# Patient Record
Sex: Female | Born: 1965 | Race: White | Hispanic: No | Marital: Married | Smoking: Never smoker
Health system: Southern US, Community
[De-identification: ages and names within clinical notes are randomized; demographics above are authoritative.]

## PROBLEM LIST (undated history)

## (undated) ENCOUNTER — Ambulatory Visit (HOSPITAL_COMMUNITY): Payer: 59

## (undated) DIAGNOSIS — S82409A Unspecified fracture of shaft of unspecified fibula, initial encounter for closed fracture: Secondary | ICD-10-CM

## (undated) DIAGNOSIS — I471 Supraventricular tachycardia, unspecified: Secondary | ICD-10-CM

## (undated) DIAGNOSIS — C4491 Basal cell carcinoma of skin, unspecified: Secondary | ICD-10-CM

## (undated) DIAGNOSIS — M84371A Stress fracture, right ankle, initial encounter for fracture: Secondary | ICD-10-CM

## (undated) DIAGNOSIS — M7740 Metatarsalgia, unspecified foot: Secondary | ICD-10-CM

## (undated) DIAGNOSIS — R55 Syncope and collapse: Secondary | ICD-10-CM

## (undated) DIAGNOSIS — M65979 Unspecified synovitis and tenosynovitis, unspecified ankle and foot: Secondary | ICD-10-CM

## (undated) DIAGNOSIS — M25572 Pain in left ankle and joints of left foot: Secondary | ICD-10-CM

## (undated) DIAGNOSIS — M659 Synovitis and tenosynovitis, unspecified: Secondary | ICD-10-CM

## (undated) DIAGNOSIS — M25552 Pain in left hip: Secondary | ICD-10-CM

## (undated) HISTORY — DX: Stress fracture, right ankle, initial encounter for fracture: M84.371A

## (undated) HISTORY — DX: Pain in left ankle and joints of left foot: M25.572

## (undated) HISTORY — DX: Synovitis and tenosynovitis, unspecified: M65.9

## (undated) HISTORY — DX: Syncope and collapse: R55

## (undated) HISTORY — DX: Supraventricular tachycardia: I47.1

## (undated) HISTORY — DX: Unspecified fracture of shaft of unspecified fibula, initial encounter for closed fracture: S82.409A

## (undated) HISTORY — PX: WISDOM TOOTH EXTRACTION: SHX21

## (undated) HISTORY — DX: Supraventricular tachycardia, unspecified: I47.10

## (undated) HISTORY — DX: Metatarsalgia, unspecified foot: M77.40

## (undated) HISTORY — PX: MOHS SURGERY: SHX181

## (undated) HISTORY — DX: Unspecified synovitis and tenosynovitis, unspecified ankle and foot: M65.979

## (undated) HISTORY — DX: Basal cell carcinoma of skin, unspecified: C44.91

## (undated) HISTORY — DX: Pain in left hip: M25.552

---

## 1999-03-18 ENCOUNTER — Other Ambulatory Visit: Admission: RE | Admit: 1999-03-18 | Discharge: 1999-03-18 | Payer: Self-pay | Admitting: *Deleted

## 2000-04-12 ENCOUNTER — Other Ambulatory Visit: Admission: RE | Admit: 2000-04-12 | Discharge: 2000-04-12 | Payer: Self-pay | Admitting: *Deleted

## 2001-04-14 ENCOUNTER — Other Ambulatory Visit: Admission: RE | Admit: 2001-04-14 | Discharge: 2001-04-14 | Payer: Self-pay | Admitting: *Deleted

## 2002-04-18 ENCOUNTER — Other Ambulatory Visit: Admission: RE | Admit: 2002-04-18 | Discharge: 2002-04-18 | Payer: Self-pay | Admitting: *Deleted

## 2003-05-25 ENCOUNTER — Other Ambulatory Visit: Admission: RE | Admit: 2003-05-25 | Discharge: 2003-05-25 | Payer: Self-pay | Admitting: *Deleted

## 2007-03-22 ENCOUNTER — Other Ambulatory Visit: Admission: RE | Admit: 2007-03-22 | Discharge: 2007-03-22 | Payer: Self-pay | Admitting: Obstetrics and Gynecology

## 2007-12-22 ENCOUNTER — Ambulatory Visit: Payer: Self-pay | Admitting: Cardiology

## 2008-01-10 ENCOUNTER — Encounter: Payer: Self-pay | Admitting: Cardiology

## 2008-01-10 ENCOUNTER — Ambulatory Visit: Payer: Self-pay | Admitting: Cardiology

## 2008-01-10 ENCOUNTER — Ambulatory Visit: Payer: Self-pay

## 2008-01-10 LAB — CONVERTED CEMR LAB
CO2: 27 meq/L (ref 19–32)
Chloride: 105 meq/L (ref 96–112)
Magnesium: 2.4 mg/dL (ref 1.5–2.5)
Potassium: 5.2 meq/L — ABNORMAL HIGH (ref 3.5–5.1)
Sodium: 139 meq/L (ref 135–145)
TSH: 0.86 microintl units/mL (ref 0.35–5.50)

## 2008-02-02 ENCOUNTER — Ambulatory Visit: Payer: Self-pay | Admitting: Cardiology

## 2008-03-22 ENCOUNTER — Other Ambulatory Visit: Admission: RE | Admit: 2008-03-22 | Discharge: 2008-03-22 | Payer: Self-pay | Admitting: Obstetrics and Gynecology

## 2008-04-04 ENCOUNTER — Ambulatory Visit: Payer: Self-pay | Admitting: Internal Medicine

## 2008-10-29 ENCOUNTER — Ambulatory Visit: Payer: Self-pay | Admitting: Sports Medicine

## 2008-10-29 DIAGNOSIS — M25559 Pain in unspecified hip: Secondary | ICD-10-CM | POA: Insufficient documentation

## 2008-10-29 DIAGNOSIS — M7750 Other enthesopathy of unspecified foot: Secondary | ICD-10-CM | POA: Insufficient documentation

## 2008-10-30 ENCOUNTER — Telehealth (INDEPENDENT_AMBULATORY_CARE_PROVIDER_SITE_OTHER): Payer: Self-pay | Admitting: *Deleted

## 2008-12-11 ENCOUNTER — Ambulatory Visit: Payer: Self-pay | Admitting: Sports Medicine

## 2008-12-19 ENCOUNTER — Ambulatory Visit: Payer: Self-pay | Admitting: Sports Medicine

## 2008-12-19 DIAGNOSIS — M25579 Pain in unspecified ankle and joints of unspecified foot: Secondary | ICD-10-CM | POA: Insufficient documentation

## 2008-12-19 DIAGNOSIS — S82409A Unspecified fracture of shaft of unspecified fibula, initial encounter for closed fracture: Secondary | ICD-10-CM | POA: Insufficient documentation

## 2008-12-27 ENCOUNTER — Telehealth (INDEPENDENT_AMBULATORY_CARE_PROVIDER_SITE_OTHER): Payer: Self-pay | Admitting: *Deleted

## 2009-01-01 ENCOUNTER — Ambulatory Visit: Payer: Self-pay | Admitting: Sports Medicine

## 2009-01-30 ENCOUNTER — Ambulatory Visit: Payer: Self-pay | Admitting: Sports Medicine

## 2009-04-16 ENCOUNTER — Other Ambulatory Visit: Admission: RE | Admit: 2009-04-16 | Discharge: 2009-04-16 | Payer: Self-pay | Admitting: Obstetrics and Gynecology

## 2009-04-24 ENCOUNTER — Ambulatory Visit: Payer: Self-pay | Admitting: Sports Medicine

## 2009-04-24 DIAGNOSIS — M659 Synovitis and tenosynovitis, unspecified: Secondary | ICD-10-CM | POA: Insufficient documentation

## 2009-04-24 DIAGNOSIS — M8430XA Stress fracture, unspecified site, initial encounter for fracture: Secondary | ICD-10-CM | POA: Insufficient documentation

## 2009-05-09 ENCOUNTER — Ambulatory Visit: Payer: Self-pay | Admitting: Sports Medicine

## 2009-05-28 ENCOUNTER — Encounter (INDEPENDENT_AMBULATORY_CARE_PROVIDER_SITE_OTHER): Payer: Self-pay | Admitting: *Deleted

## 2009-06-06 ENCOUNTER — Encounter: Payer: Self-pay | Admitting: Sports Medicine

## 2009-06-06 ENCOUNTER — Ambulatory Visit: Payer: Self-pay | Admitting: Sports Medicine

## 2009-06-07 DIAGNOSIS — I498 Other specified cardiac arrhythmias: Secondary | ICD-10-CM | POA: Insufficient documentation

## 2009-06-07 DIAGNOSIS — R55 Syncope and collapse: Secondary | ICD-10-CM | POA: Insufficient documentation

## 2009-06-11 ENCOUNTER — Ambulatory Visit: Payer: Self-pay | Admitting: Cardiology

## 2009-07-09 ENCOUNTER — Ambulatory Visit: Payer: Self-pay | Admitting: Sports Medicine

## 2009-07-09 DIAGNOSIS — R269 Unspecified abnormalities of gait and mobility: Secondary | ICD-10-CM | POA: Insufficient documentation

## 2010-02-26 ENCOUNTER — Encounter: Payer: Self-pay | Admitting: Family Medicine

## 2010-02-26 ENCOUNTER — Ambulatory Visit: Payer: Self-pay | Admitting: Sports Medicine

## 2010-08-28 NOTE — Assessment & Plan Note (Signed)
Summary: L LATERAL ANKLE PAIN X MON   Vital Signs:  Patient profile:   45 year old female BP sitting:   102 / 67  Vitals Entered By: Lillia Pauls CMA (February 26, 2010 2:51 PM)  Primary Samiksha Pellicano:  [facility]   History of Present Illness: 45 yo F avid runner her for foot/ankle pain.  Runs 4-5 mi/day. Everted L ankle sprain during her run 2 days ago. Began having swelling and pain that night and yesterday. Swelling is better today. Pain is located on anterior aspect of lateral malleolus. Sharp feeling similar to prior stress fx on  R med malleolus and post aspect of lat malleolus. Able to walk fine today.  Allergies: No Known Drug Allergies  Physical Exam  General:  Well-developed,well-nourished,in no acute distress; alert,appropriate and cooperative throughout examination   Foot/Ankle Exam   L Ankle: No visible erythema or swelling. Range of motion is full in all directions. Strength is 5/5 in all directions. Stable lateral and medial ligaments; squeeze test and kleiger test unremarkable; Talar dome nontender; No pain at base of 5th MT; No tenderness over cuboid; No tenderness over N spot or navicular prominence No tenderness on posterior aspects of medial malleolus, mild TTP on ant aspect No sign of peroneal tendon subluxations; Negative tarsal tunnel tinel's Able to walk 4 steps. Mild laxity on anterior draw, neg talar tilt. B/l Morton's foot with separation between 1st/2nd toes.  + transverse arch collpase b/l.    Impression & Recommendations:  Problem # 1:  ANKLE PAIN, LEFT (ICD-719.47) Pt concerned about repeat stress fracture, but explained only appears to be grade 1 ankle sprain given acute setting.  No need for imaging based on [place] Ankle Rules - ASO brace to wear as needed (particularly on trail runs) - NSAIDs if desired - Given handout on ankle rehab and stressed importance ligamentous strengthening.  We reviewed exercises with her today as  well and gave her theraband. - given only mild injury, f/u as needed  Orders: Ankle Training Brace/ASO Support [phone])  Complete Medication List: 1)  Voltaren 1 % Gel (Diclofenac sodium) .... Use 1 gram qid prn 2)  Calicum  .... 2000mg  daily 3)  Multivitamins Tabs (Multiple vitamin) .Marland Kitchen.. 1 tab once daily

## 2010-12-09 NOTE — Assessment & Plan Note (Signed)
[facility]                            CARDIOLOGY OFFICE NOTE   NAME:Whiteford, IDALIA ALLBRITTON                  MRN:          [id number]  DATE:12/22/2007                            DOB:          Jul 08, 1966    REFERRING PHYSICIAN:  Sigmund Hazel, M.D.   REFERRING PHYSICIAN AND PRIMARY CARE PHYSICIAN:  Dr. Sigmund Hazel with  [facility] at [facility].   REASON FOR REFERRAL:  Evaluation of palpitations.   CLINICAL HISTORY:  Ms. Mannina is 45 years old and is known to me through  her parents, Takiya Belmares and Mr. Frederica Kuster who are patient's of mine.  She  also works in the Engineer, civil (consulting) for [employer]  She has  had tachy palpitations for about two years although they may have been  slightly more frequent recently.  She has become an active runner over  the last 6-12 months, and she has noticed that the symptoms occur most  off and then almost exclusively now with running.  She normally gets her  heart rate up to about 170 which is near her maximum predicted rate when  she runs, but some times her heart rate will jump up over 201 and once  to 240.  She usually stops her running when this happens, and the heart  rate will usually suddenly slow down.  She wears a heart rate monitor  but has not noticed whether her heart rate is irregular.  She has no  other symptoms other than the palpitations associated with the rapid  heart rate.  She has had no chest pain or shortness of breath.   She does not drink caffeine or excess alcohol.  She also has no prior  history of known heart disease.   Her past medical history is mostly negative.  She has had a previous C-  section in 1997.  There is no history of diabetes, hypertension, or  hyperlipidemia.   SOCIAL HISTORY:  She is married and has one 43 year old boy.  She does  not smoke.   FAMILY HISTORY:  Is positive in her father has had previous bypass  surgery and her mother has an SVT and  left bundle branch block.   REVIEW OF SYSTEMS:  Is negative apart from the symptoms related to the  present illness.   MEDICATIONS:  Include only vitamins and calcium.   On examination, blood pressure was 105/67, pulse 65 and regular.  There was no venous distension.  The carotid pulses were full.  There  were no bruits.  CHEST:  Was clear without rales or rhonchi.  CARDIAC:  Rhythm was regular.  The first and second heart sounds were  normal.  I could hear no murmurs, gallops or clicks.  ABDOMEN:  Soft with normal bowel sounds.  There was no  hepatosplenomegaly.  Peripheral pulses were full.  There was no  peripheral edema.  MUSCULOSKELETAL:  Showed no deformities.  SKIN:  Was warm and dry.  NEUROLOGIC EXAM:  Showed no focal neurological signs.   Electrocardiogram showed incomplete right bundle branch block and was  normal.   IMPRESSION:  Tachy  palpitations related to exercise.   RECOMMENDATIONS:  It sounds most likely like Ms. Massoud has an  arrhythmia on exercise related to arrhythmia of uncertain etiology.  The  arrhythmia is somewhat infrequent which would make it difficult to catch  on even an event monitor.  I think the most important thing is to rule  out structural heart disease, and we plan to evaluate her with a rest  stress echocardiogram.  We also get a TSH, BMP and magnesium level.  I  will talk to her by phone after we get the results of all of these  tests.  If these are negative, then we can then make a decision on  whether to empirically try her on a drug to prevent the tachy  palpitations.  Her resting heart rate is somewhat slow which may limit  our ability to treat her for this.  We might consider treating her with  p.r.n. beta blocker just before her runs.  I think she is most concerned  about being certain this is not a problem that puts her at serious risk.     Bruce Elvera Lennox Juanda Chance, MD, [facility]  Electronically Signed    BRB/MedQ  DD: 12/22/2007  DT:  12/22/2007  Job #: [id number]

## 2010-12-09 NOTE — Letter (Signed)
April 04, 2008    Stacey R. Juanda Chance, MD, [facility]  [address]  [place], [place] [address].   RE:  Stacey Bennett  MRN:  [id number]  /  DOB:  Apr 02, 1966   Dear Stacey Bennett,   It was a pleasure of seeing Stacey Bennett today at your request because of  a supraventricular tachycardia.   As you know, she is a 45 year old married mother of 1, who has a 71-74-  year-old history of recurrent abrupt onset offset tachy palpitations,  which lasted couple of minutes.  These episodes then spontaneously  resolved.   She started running about 18 months ago and has noted over the last year  at about every 30 day, she will have abrupt onset of an episode while  running.  If she stops running, the episodes last 2-3 minutes and then  resolves spontaneously.  They are unassociated with lightheadedness,  shortness of breath, or chest discomfort.   She notes that there has been no episodes, not while running in the last  year and a half.  These episodes are FROG negative.   Her family history is notable for her mother having some type of  tachycardia.   Cardiac evaluation has included a stress echo, which demonstrated normal  left ventricular function with and without exercise.   Her past medical history is notable for urinary tract infections and  some old allergies.  She also has history of syncope, which was low  blood sugar but occurred while standing for a long time and while  pregnant.  I suspect it was neurally mediated.   Her past surgical history is notable for C-sections.   Social history is noted above.  She is married.  She does not use  cigarettes or recreational drugs.  She does drink alcohol occasionally  and she works as a Production designer, theatre/television/film for [employer]   On examination, she is a young to middle-aged Caucasian female appearing  her stated age of 61.  Her blood pressure was 90/60.  Pulse was 64 and  her weight was 133.  Her HEENT exam demonstrated no icterus or xanthoma.  The neck veins were flat.  The carotids are brisk and full bilaterally  without bruits.  The back was without kyphosis or scoliosis.  Lungs were  clear.  Heart sounds were regular with a widely split S1.  No widely  split S2.  The abdomen was soft with active bowel sounds without midline  pulsation or hepatomegaly.  Femoral pulses 2+.  Distal pulses were  intact.  There is no clubbing, cyanosis, or edema.  Neurologic exam was  grossly normal.  Skin was warm and dry.   Electrocardiogram dated today demonstrated sinus rhythm at 59 with  intervals 0.14/0.08/0.42.  There was no evidence of ventricular  preexcitation.   Review of her event recorder strip demonstrated the onset of a narrow  QRS tachycardia abruptly with exercise.  Rather unfortunately the  transition occurs at a page split.  However, we can see clear abrupt  change in the RR interval without evidence of PR prolongation.  There is  further suggestion of an inscribed P-wave in the proximal ST segment.  These results are consistent with a day prior AV nodal reentry  tachycardia.   IMPRESSION:  1. 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  2. Minimal symptoms associated supraventricular tachycardia.  3. History of syncope, probably neurally mediated.   Stacey Bennett, Stacey Bennett has  supraventricular tachycardia that occurs primarily  with exercise, it is relatively short lived and is associated with just  sparse symptoms.  We reviewed the physiology as well as the prognosis of  her tachycardia.  We discussed treatment options including vagal  maneuvers specifically squatting carotid massage and Valsalva.  We  discussed the use a p.r.n. or daily beta blockers and antiarrhythmic  drug therapy.  We discussed EP study with catheter ablation including  not limited to risks of death, perforation, or heart block.  She  understands these risks.   After discussion, she is  elected to do nothing, a decision with which I  concur.  She is to let us know if there is any progression her symptoms.  We will be glad to see her at that time.   Thank you very much for the consultation.    Sincerely,      Stacey Salvia, MD, [facility]  Electronically Signed    SCK/MedQ  DD: 04/04/2008  DT: 04/05/2008  Job #: [id number]   CC:    Stacey Bennett, M.D.

## 2011-02-03 ENCOUNTER — Encounter: Payer: Self-pay | Admitting: Cardiology

## 2011-06-25 ENCOUNTER — Other Ambulatory Visit: Payer: Self-pay | Admitting: Obstetrics and Gynecology

## 2011-06-25 ENCOUNTER — Other Ambulatory Visit (HOSPITAL_COMMUNITY)
Admission: RE | Admit: 2011-06-25 | Discharge: 2011-06-25 | Disposition: A | Discharge status: Home / Self Care | Payer: BC Managed Care – PPO | Source: Ambulatory Visit | Attending: Obstetrics and Gynecology | Admitting: Obstetrics and Gynecology

## 2011-06-25 DIAGNOSIS — Z01419 Encounter for gynecological examination (general) (routine) without abnormal findings: Secondary | ICD-10-CM | POA: Insufficient documentation

## 2012-06-26 ENCOUNTER — Encounter (HOSPITAL_BASED_OUTPATIENT_CLINIC_OR_DEPARTMENT_OTHER): Payer: Self-pay | Admitting: *Deleted

## 2012-06-26 ENCOUNTER — Emergency Department (HOSPITAL_BASED_OUTPATIENT_CLINIC_OR_DEPARTMENT_OTHER): Payer: BC Managed Care – PPO

## 2012-06-26 ENCOUNTER — Emergency Department (HOSPITAL_BASED_OUTPATIENT_CLINIC_OR_DEPARTMENT_OTHER)
Admission: EM | Admit: 2012-06-26 | Discharge: 2012-06-26 | Disposition: A | Discharge status: Home / Self Care | Payer: BC Managed Care – PPO | Attending: Emergency Medicine | Admitting: Emergency Medicine

## 2012-06-26 DIAGNOSIS — Y9389 Activity, other specified: Secondary | ICD-10-CM | POA: Insufficient documentation

## 2012-06-26 DIAGNOSIS — Z85828 Personal history of other malignant neoplasm of skin: Secondary | ICD-10-CM | POA: Insufficient documentation

## 2012-06-26 DIAGNOSIS — Z8739 Personal history of other diseases of the musculoskeletal system and connective tissue: Secondary | ICD-10-CM | POA: Insufficient documentation

## 2012-06-26 DIAGNOSIS — Y929 Unspecified place or not applicable: Secondary | ICD-10-CM | POA: Insufficient documentation

## 2012-06-26 DIAGNOSIS — Z8679 Personal history of other diseases of the circulatory system: Secondary | ICD-10-CM | POA: Insufficient documentation

## 2012-06-26 DIAGNOSIS — X500XXA Overexertion from strenuous movement or load, initial encounter: Secondary | ICD-10-CM | POA: Insufficient documentation

## 2012-06-26 DIAGNOSIS — Z8781 Personal history of (healed) traumatic fracture: Secondary | ICD-10-CM | POA: Insufficient documentation

## 2012-06-26 DIAGNOSIS — Z79899 Other long term (current) drug therapy: Secondary | ICD-10-CM | POA: Insufficient documentation

## 2012-06-26 DIAGNOSIS — S63509A Unspecified sprain of unspecified wrist, initial encounter: Secondary | ICD-10-CM | POA: Insufficient documentation

## 2012-06-26 DIAGNOSIS — Z87312 Personal history of (healed) stress fracture: Secondary | ICD-10-CM | POA: Insufficient documentation

## 2012-06-26 HISTORY — DX: Basal cell carcinoma of skin, unspecified: C44.91

## 2012-06-26 NOTE — ED Provider Notes (Signed)
History     CSN: [id number]  Arrival date & time 06/26/12  1446   First MD Initiated Contact with Patient 06/26/12 1535      Chief Complaint  Patient presents with  . Wrist Pain    (Consider location/radiation/quality/duration/timing/severity/associated sxs/prior treatment) HPI Comments: 46 year old female presents the emergency department complaining of left wrist pain since Friday after she put her hand down on a chair to change positions. She states she heard and felt a "pop". Describes the pain as throbbing at rest only rated 3/10, worse with movement increasing to 5/10. Ibuprofen provides mild relief. Denies any swelling. No numbness or tingling in her hand fingers.  Patient is a 46 y.o. female presenting with wrist pain. The history is provided by the patient.  Wrist Pain Pertinent negatives include no joint swelling or numbness.    Past Medical History  Diagnosis Date  . Supraventricular tachycardia   . Tenosynovitis of foot and ankle   . Stress fracture of right ankle   . Fracture, fibula   . Ankle pain, left   . Metatarsalgia   . Hip pain, left   . Syncope   . Basal cell cancer     Past Surgical History  Procedure Date  . Cesarean section   . Wisdom tooth extraction   . Mohs surgery     No family history on file.  History  Substance Use Topics  . Smoking status: Never Smoker   . Smokeless tobacco: Never Used  . Alcohol Use: Not on file    OB History    Grav Para Term Preterm Abortions TAB SAB Ect Mult Living                  Review of Systems  Constitutional: Negative.   Musculoskeletal: Negative for joint swelling.       Positive for left wrist pain.  Skin: Negative for color change.  Neurological: Negative for numbness.    Allergies  Review of patient's allergies indicates no known allergies.  Home Medications   Current Outpatient Rx  Name  Route  Sig  Dispense  Refill  . ACETAMINOPHEN 500 MG PO TABS   Oral   Take 1,000 mg by mouth  every 6 (six) hours as needed.         Marland Kitchen CALCIUM + D PO   Oral   Take 2,000 mg by mouth daily.           . IBUPROFEN 200 MG PO TABS   Oral   Take 400 mg by mouth every 6 (six) hours as needed.         . MULTIVITAL PO TABS   Oral   Take 1 tablet by mouth daily.           Marland Kitchen DICLOFENAC SODIUM 1 % TD GEL   Topical   Apply topically 4 (four) times daily as needed.             BP 100/62  Pulse 61  Temp 98.9 F (37.2 C) (Oral)  Resp 18  SpO2 100%  LMP 06/05/2012  Physical Exam  Nursing note and vitals reviewed. Constitutional: She is oriented to person, place, and time. She appears well-developed and well-nourished. No distress.  HENT:  Head: Normocephalic and atraumatic.  Eyes: Conjunctivae normal are normal.  Neck: Normal range of motion.  Cardiovascular: Normal rate, regular rhythm, normal heart sounds and intact distal pulses.   Pulmonary/Chest: Effort normal and breath sounds normal.  Musculoskeletal:  Left wrist: She exhibits decreased range of motion (with flexion due to pain) and tenderness (under base of first MTP). She exhibits no swelling and no deformity.       Left hand: She exhibits normal range of motion, no tenderness and normal capillary refill. normal sensation noted.  Neurological: She is alert and oriented to person, place, and time. No sensory deficit.  Skin: Skin is warm and dry. No erythema.  Psychiatric: She has a normal mood and affect. Her behavior is normal.    ED Course  Procedures (including critical care time)  Labs Reviewed - No data to display Dg Wrist Complete Left  06/26/2012  *RADIOLOGY REPORT*  Clinical Data: Wrist pain.  Injured breast on Friday.  LEFT WRIST - COMPLETE 3+ VIEW  Comparison: None.  Findings: The carpals are aligned.  Bony mineralization appears normal.  No acute or healing fracture is identified.  No discrete soft tissue swelling is appreciated. No significant degenerative changes.  IMPRESSION: Negative.    Original Report Authenticated By: Britta Mccreedy, M.D.      1. Wrist sprain       MDM  46 y/o female with left wrist sprain. X-ray unremarkable. No neuro deficits. No snuffbox tenderness. Wrist splint applied. Discussed RICE.         Trevor Mace, PA-C 06/26/12 1557

## 2012-06-26 NOTE — ED Notes (Signed)
Pt reports she injured left wrist on Friday when using her hand to push up and change positions- states heard a "pop"- pain worse today

## 2012-06-28 NOTE — ED Provider Notes (Signed)
Medical screening examination/treatment/procedure(s) were performed by non-physician practitioner and as supervising physician I was immediately available for consultation/collaboration.   Carleene Cooper III, MD 06/28/12 [phone]

## 2012-07-29 ENCOUNTER — Other Ambulatory Visit (HOSPITAL_COMMUNITY)
Admission: RE | Admit: 2012-07-29 | Discharge: 2012-07-29 | Disposition: A | Discharge status: Home / Self Care | Payer: BC Managed Care – PPO | Source: Ambulatory Visit | Attending: Obstetrics and Gynecology | Admitting: Obstetrics and Gynecology

## 2012-07-29 ENCOUNTER — Other Ambulatory Visit: Payer: Self-pay | Admitting: Obstetrics and Gynecology

## 2012-07-29 DIAGNOSIS — Z01419 Encounter for gynecological examination (general) (routine) without abnormal findings: Secondary | ICD-10-CM | POA: Insufficient documentation

## 2012-08-15 ENCOUNTER — Ambulatory Visit (INDEPENDENT_AMBULATORY_CARE_PROVIDER_SITE_OTHER): Payer: BC Managed Care – PPO | Admitting: Sports Medicine

## 2012-08-15 VITALS — BP 118/75 | Ht 67.0 in | Wt 130.0 lb

## 2012-08-15 DIAGNOSIS — S86819A Strain of other muscle(s) and tendon(s) at lower leg level, unspecified leg, initial encounter: Secondary | ICD-10-CM

## 2012-08-15 DIAGNOSIS — M25579 Pain in unspecified ankle and joints of unspecified foot: Secondary | ICD-10-CM

## 2012-08-15 DIAGNOSIS — S838X9A Sprain of other specified parts of unspecified knee, initial encounter: Secondary | ICD-10-CM

## 2012-08-15 DIAGNOSIS — S86319A Strain of muscle(s) and tendon(s) of peroneal muscle group at lower leg level, unspecified leg, initial encounter: Secondary | ICD-10-CM

## 2012-08-15 NOTE — Progress Notes (Signed)
  Subjective:    Patient ID: Stacey Bennett, female    DOB: 1966/02/06, 47 y.o.   MRN: [id number]  HPI chief complaint: Right ankle pain  47 year old female comes in today complaining of one week of right ankle pain. She was out for a trail run last weekend when she suffered 2 separate inversion injuries to the right ankle. She was able to continue running but had pain afterwards. She localizes all of her pain to the lateral ankle mainly along the distal fibula. She's not noticed any swelling. She's not noticed any ecchymosis. She had an avulsion fracture in the left ankle in 2010 and her current pain feels similar to that. She also has a history of a medial malleolar stress fracture in this same right ankle. She denies any medial pain today. No significant limp. No pain in her foot. She is currently training for a half marathon in March.  Interim medical history is unchanged. She is otherwise healthy. Takes no chronic medications. No known drug allergies.    Review of Systems     Objective:   Physical Exam Well-developed, well-nourished. No acute distress. Awake alert and oriented x3  Right ankle: Full range of motion. No effusion. No soft tissue swelling. No ecchymosis. Positive anterior drawer, 2+ talar tilt. There is no tenderness to palpation over the distal fibula but there is pain with percussion. Mild pain along the peroneal tendons just posterior to the distal fibula but no appreciable swelling here. No tenderness over the medial malleolus. No tenderness at the base of the fifth metatarsal. No tenderness at the navicular. Neurovascularly intact distally. Patient walks with a very slightly limp.  MSK ultrasound of the right ankle: Images were obtained in long and short view. Although no discrete cortical disruption is seen along the distal fibula there is significant increased neovascularity which likely represents a stress reaction here. There is also fluid surrounding the peroneal longus  and peroneal brevis tendon just distal to the lateral malleolus. I do not see any discrete tear.       Assessment & Plan:  1. Right ankle pain secondary to distal fibular stress reaction/peroneal tendon strain secondary to inversion injury.  I believe this patient likely has some underlying lateral ankle instability. I recommended a body helix compression sleeve and crosstraining on a bike for the next 2 weeks. She is well-versed in ankle rehabilitation exercises so she will resume these at home (she'll concentrate on range of motion exercises for the time being). Ice at the end of her workouts. Followup with me in 2 weeks. We will repeat her ultrasound at that time. Call with questions or concerns in the interim.

## 2012-08-15 NOTE — Patient Instructions (Addendum)
Thank you for coming in today.  The ultrasound shows what appears to be a stress reaction of the distal fibula as well as a peroneal tendon strain. Try the compression sleeve (do not sleep in it) and cross train on a bike for the next 2 weeks. Be sure to do your plantar flexion and dorsi flexion exercises.  We will plan on repeating her ultrasound at her followup visit. Please call with questions or concerns in the interim.

## 2012-08-29 ENCOUNTER — Ambulatory Visit: Payer: BC Managed Care – PPO | Admitting: Sports Medicine

## 2012-10-20 ENCOUNTER — Ambulatory Visit (INDEPENDENT_AMBULATORY_CARE_PROVIDER_SITE_OTHER): Payer: BC Managed Care – PPO | Admitting: Sports Medicine

## 2012-10-20 ENCOUNTER — Ambulatory Visit
Admission: RE | Admit: 2012-10-20 | Discharge: 2012-10-20 | Disposition: A | Discharge status: Home / Self Care | Payer: BC Managed Care – PPO | Source: Ambulatory Visit | Attending: Sports Medicine | Admitting: Sports Medicine

## 2012-10-20 VITALS — BP 98/70 | Ht 67.0 in | Wt 130.0 lb

## 2012-10-20 DIAGNOSIS — M25571 Pain in right ankle and joints of right foot: Secondary | ICD-10-CM

## 2012-10-20 DIAGNOSIS — M24876 Other specific joint derangements of unspecified foot, not elsewhere classified: Secondary | ICD-10-CM

## 2012-10-20 DIAGNOSIS — M24873 Other specific joint derangements of unspecified ankle, not elsewhere classified: Secondary | ICD-10-CM

## 2012-10-20 DIAGNOSIS — M25371 Other instability, right ankle: Secondary | ICD-10-CM

## 2012-10-20 DIAGNOSIS — M25579 Pain in unspecified ankle and joints of unspecified foot: Secondary | ICD-10-CM

## 2012-10-21 NOTE — Progress Notes (Signed)
  Subjective:    Patient ID: Stacey Bennett, female    DOB: 19-Apr-1966, 47 y.o.   MRN: [id number]  HPI Patient comes in today with persistent right ankle pain. She was last seen in the office on 08/15/2012 after she suffered an inversion injury to the ankle while running. Ultrasound at that time showed increased neovascularization along the distal fibula concerning for possible stress reaction. She was given a body helix compression sleeve, asked to cross train for a couple weeks and then followup with me. She failed to keep that followup appointment but despite this her symptoms did improve. However, they did not completely resolve. She has been able to return to running and in fact states that she has little pain with running. But, it is afterwards that she experiences pain right over the distal fibula. She claims that it is "right on the bone". She has not noticed any swelling. No foot pain. No medial ankle pain. She continues to wear her body helix compression sleeve with running. 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 She used to be a heel strike or at Dr. Darrick Penna asked her to change her gait. Unfortunately, she did not bring her running shoes with her. She has also been fitted with orthotics previously but it has been several years.  Her interim medical history is unchanged.    Review of Systems     Objective:   Physical Exam Well-developed, fit-appearing. No acute distress.  Right ankle: Full range of motion. No effusion. No soft tissue swelling. There is very mild tenderness to palpation at the posterior aspect of the distal fibula. No pain with percussion over the fibula. No tenderness over the peroneal tendons. No pain with resisted foot eversion. No tenderness along the joint line or over the medial malleolus. No tenderness at the navicular. 1+ anterior drawer with 2+ talar tilt. Neurovascularly intact distally. Walking without a limp.  MSK ultrasound of the right ankle: Images of the  lateral ankle including long and short views were obtained. There is a small hyperechoic density just posterior to the lateral malleolus consistent with a probable small bony avulsion. Persistent increased neovascularization on the distal fibula at the same area. Peroneal tendons appear to be within normal limits. The rest of the distal fibula also within normal limits.  X-rays of the right ankle including AP, mortise, and lateral views are unremarkable. No obvious stress fracture.       Assessment & Plan:  1. Persistent right ankle pain with ultrasound evidence of small avulsion fracture from the distal fibula 2. Ankle instability  Avulsion fracture is small enough that I don't think it should keep her from running. Her persistent symptoms may in fact be due to biomechanics and her underlying ankle instability. My suspicion is that she's getting some hindfoot wobble when running on her forefoot. I've recommended that she schedule an appointment so that we can evaluate her running form as well as her orthotics. In the meantime, she is well-versed in ankle stabilization exercises. She will continue with her compression sleeve with running. Continue with activity as tolerated.

## 2014-05-26 IMAGING — CR DG WRIST COMPLETE 3+V*L*
4 series · 4 of 4 positions shown · non-contrast
Comparison: None.

CLINICAL DATA: Wrist pain.  Injured breast on [REDACTED].

LEFT WRIST - COMPLETE 3+ VIEW

[x wrist pa left]
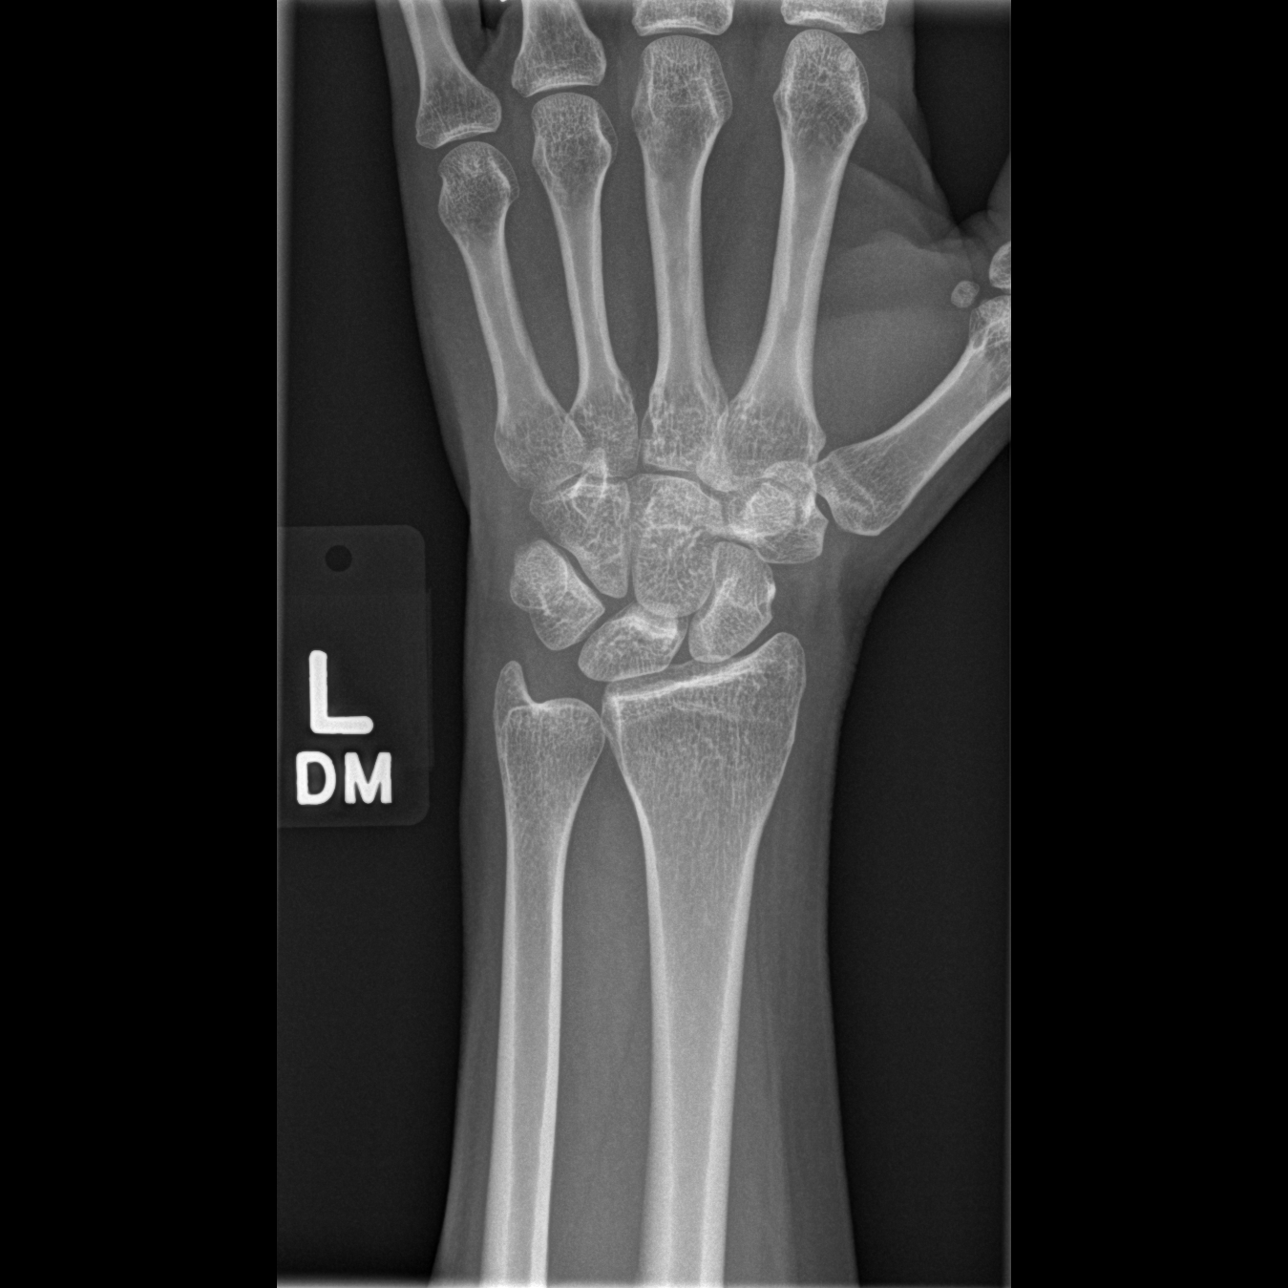

[x wrist obl left]
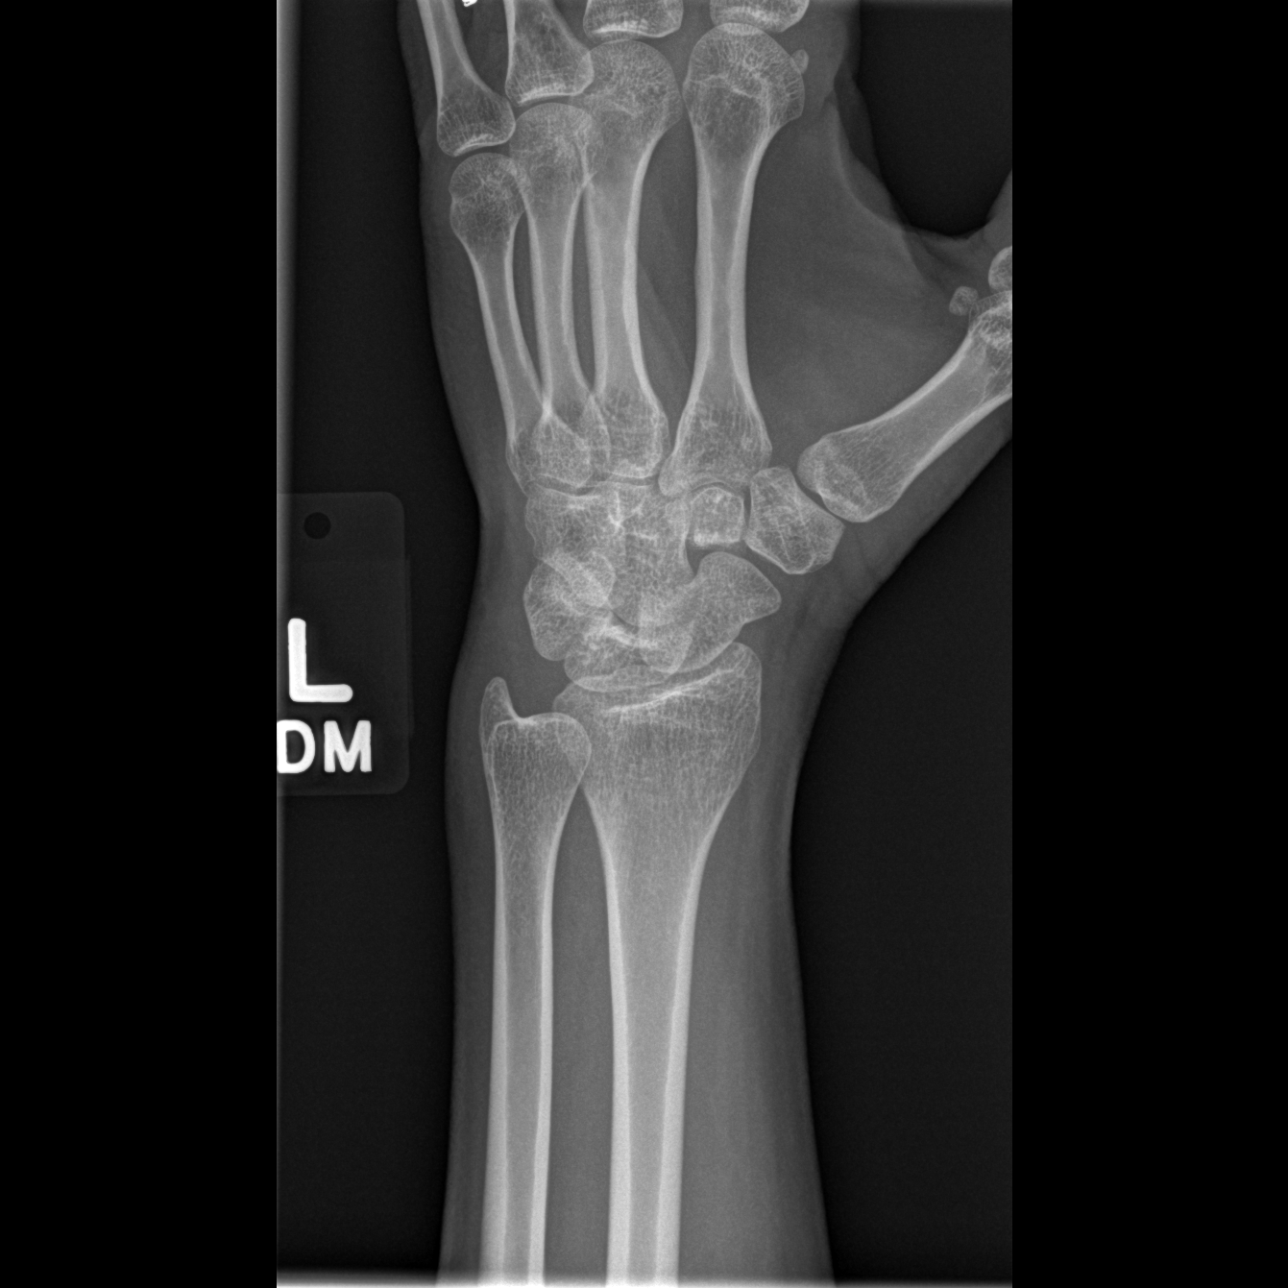

[x wrist lat left]
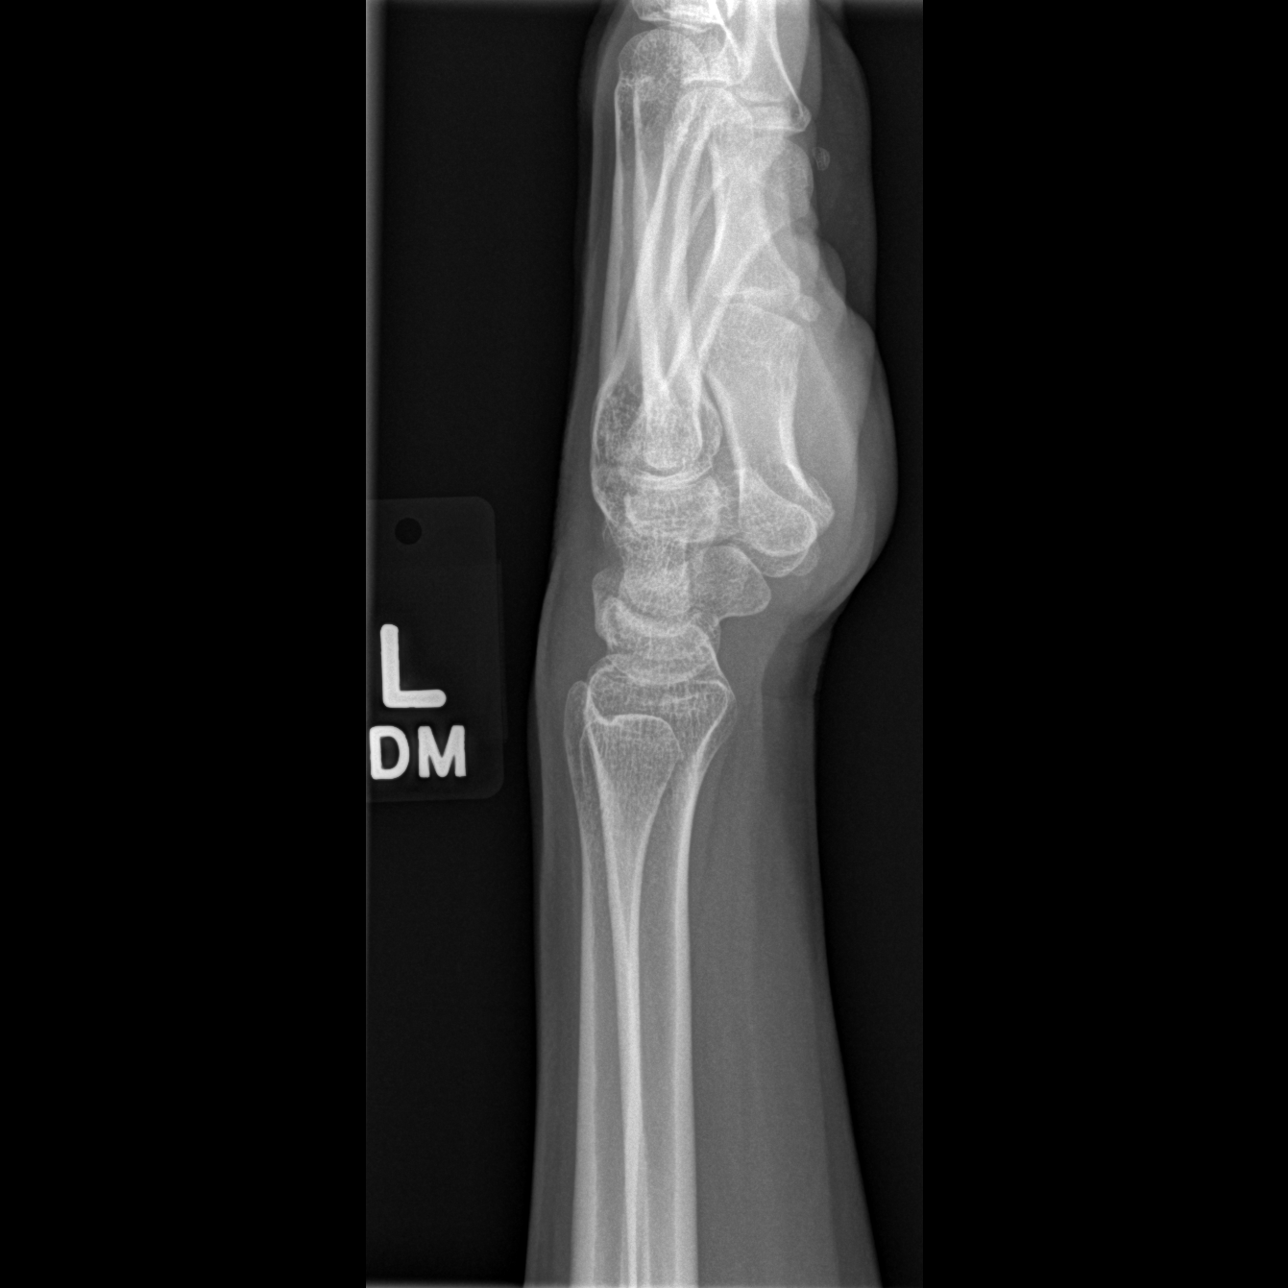

[x navicular]
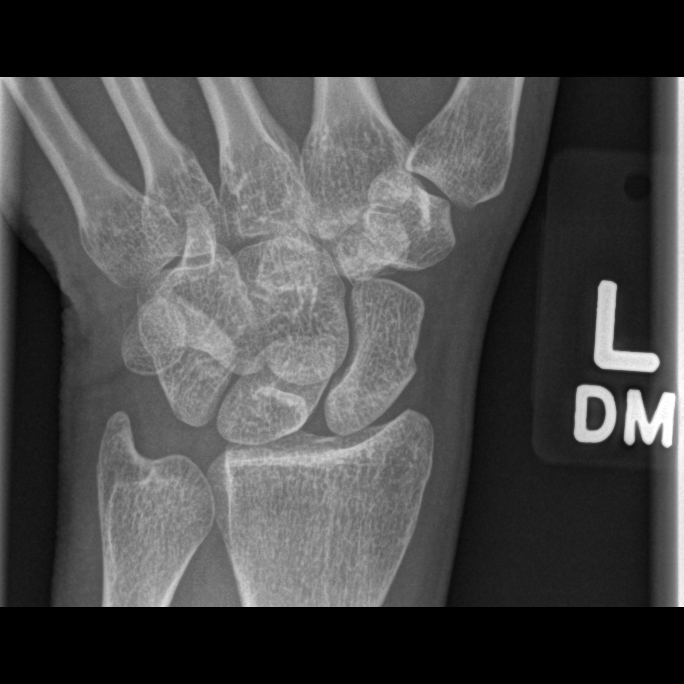

[4 of 4 positions shown; findings below may reference images not displayed]

FINDINGS: The carpals are aligned.  Bony mineralization appears
normal.  No acute or healing fracture is identified.  No discrete
soft tissue swelling is appreciated. No significant degenerative
changes.
IMPRESSION: Negative.

## 2016-01-06 DIAGNOSIS — Z1231 Encounter for screening mammogram for malignant neoplasm of breast: Secondary | ICD-10-CM | POA: Diagnosis not present

## 2016-01-21 DIAGNOSIS — H5201 Hypermetropia, right eye: Secondary | ICD-10-CM | POA: Diagnosis not present

## 2016-02-24 DIAGNOSIS — M542 Cervicalgia: Secondary | ICD-10-CM | POA: Diagnosis not present

## 2016-02-24 DIAGNOSIS — M5412 Radiculopathy, cervical region: Secondary | ICD-10-CM | POA: Diagnosis not present

## 2016-02-28 DIAGNOSIS — M542 Cervicalgia: Secondary | ICD-10-CM | POA: Diagnosis not present

## 2016-03-03 DIAGNOSIS — M5412 Radiculopathy, cervical region: Secondary | ICD-10-CM | POA: Diagnosis not present

## 2016-03-09 DIAGNOSIS — M5412 Radiculopathy, cervical region: Secondary | ICD-10-CM | POA: Diagnosis not present

## 2016-03-10 ENCOUNTER — Other Ambulatory Visit: Payer: Self-pay | Admitting: Orthopedic Surgery

## 2016-03-10 DIAGNOSIS — M542 Cervicalgia: Secondary | ICD-10-CM

## 2016-03-12 DIAGNOSIS — M5412 Radiculopathy, cervical region: Secondary | ICD-10-CM | POA: Diagnosis not present

## 2016-03-16 DIAGNOSIS — M5412 Radiculopathy, cervical region: Secondary | ICD-10-CM | POA: Diagnosis not present

## 2016-03-19 DIAGNOSIS — M5412 Radiculopathy, cervical region: Secondary | ICD-10-CM | POA: Diagnosis not present

## 2016-03-25 DIAGNOSIS — M5412 Radiculopathy, cervical region: Secondary | ICD-10-CM | POA: Diagnosis not present

## 2016-03-31 DIAGNOSIS — M5412 Radiculopathy, cervical region: Secondary | ICD-10-CM | POA: Diagnosis not present

## 2016-06-23 DIAGNOSIS — Z01419 Encounter for gynecological examination (general) (routine) without abnormal findings: Secondary | ICD-10-CM | POA: Diagnosis not present

## 2016-06-23 DIAGNOSIS — Z6822 Body mass index (BMI) 22.0-22.9, adult: Secondary | ICD-10-CM | POA: Diagnosis not present

## 2016-09-08 DIAGNOSIS — L42 Pityriasis rosea: Secondary | ICD-10-CM | POA: Diagnosis not present

## 2016-09-08 DIAGNOSIS — L7211 Pilar cyst: Secondary | ICD-10-CM | POA: Diagnosis not present

## 2017-03-03 DIAGNOSIS — Z1231 Encounter for screening mammogram for malignant neoplasm of breast: Secondary | ICD-10-CM | POA: Diagnosis not present

## 2017-06-14 DIAGNOSIS — H5201 Hypermetropia, right eye: Secondary | ICD-10-CM | POA: Diagnosis not present

## 2017-07-30 DIAGNOSIS — Z01419 Encounter for gynecological examination (general) (routine) without abnormal findings: Secondary | ICD-10-CM | POA: Diagnosis not present

## 2017-07-30 DIAGNOSIS — Z1151 Encounter for screening for human papillomavirus (HPV): Secondary | ICD-10-CM | POA: Diagnosis not present

## 2017-07-30 DIAGNOSIS — Z13220 Encounter for screening for lipoid disorders: Secondary | ICD-10-CM | POA: Diagnosis not present

## 2017-07-30 DIAGNOSIS — Z0000 Encounter for general adult medical examination without abnormal findings: Secondary | ICD-10-CM | POA: Diagnosis not present

## 2017-07-30 DIAGNOSIS — Z1329 Encounter for screening for other suspected endocrine disorder: Secondary | ICD-10-CM | POA: Diagnosis not present

## 2017-07-30 DIAGNOSIS — Z6822 Body mass index (BMI) 22.0-22.9, adult: Secondary | ICD-10-CM | POA: Diagnosis not present

## 2017-07-30 DIAGNOSIS — Z131 Encounter for screening for diabetes mellitus: Secondary | ICD-10-CM | POA: Diagnosis not present

## 2017-07-30 DIAGNOSIS — Z130 Encounter for screening for diseases of the blood and blood-forming organs and certain disorders involving the immune mechanism: Secondary | ICD-10-CM | POA: Diagnosis not present

## 2018-03-10 DIAGNOSIS — S30860A Insect bite (nonvenomous) of lower back and pelvis, initial encounter: Secondary | ICD-10-CM | POA: Diagnosis not present

## 2018-03-14 DIAGNOSIS — Z1231 Encounter for screening mammogram for malignant neoplasm of breast: Secondary | ICD-10-CM | POA: Diagnosis not present

## 2018-03-17 DIAGNOSIS — R922 Inconclusive mammogram: Secondary | ICD-10-CM | POA: Diagnosis not present

## 2018-03-30 DIAGNOSIS — L821 Other seborrheic keratosis: Secondary | ICD-10-CM | POA: Diagnosis not present

## 2018-08-02 DIAGNOSIS — D225 Melanocytic nevi of trunk: Secondary | ICD-10-CM | POA: Diagnosis not present

## 2018-08-02 DIAGNOSIS — D2239 Melanocytic nevi of other parts of face: Secondary | ICD-10-CM | POA: Diagnosis not present

## 2018-08-02 DIAGNOSIS — Z808 Family history of malignant neoplasm of other organs or systems: Secondary | ICD-10-CM | POA: Diagnosis not present

## 2018-08-02 DIAGNOSIS — D485 Neoplasm of uncertain behavior of skin: Secondary | ICD-10-CM | POA: Diagnosis not present

## 2018-08-02 DIAGNOSIS — L821 Other seborrheic keratosis: Secondary | ICD-10-CM | POA: Diagnosis not present

## 2018-08-02 DIAGNOSIS — C44111 Basal cell carcinoma of skin of unspecified eyelid, including canthus: Secondary | ICD-10-CM | POA: Diagnosis not present

## 2018-09-26 DIAGNOSIS — H52223 Regular astigmatism, bilateral: Secondary | ICD-10-CM | POA: Diagnosis not present

## 2018-10-12 DIAGNOSIS — C44311 Basal cell carcinoma of skin of nose: Secondary | ICD-10-CM | POA: Diagnosis not present

## 2019-03-30 DIAGNOSIS — L309 Dermatitis, unspecified: Secondary | ICD-10-CM | POA: Diagnosis not present

## 2019-03-30 DIAGNOSIS — L859 Epidermal thickening, unspecified: Secondary | ICD-10-CM | POA: Diagnosis not present

## 2019-05-16 ENCOUNTER — Other Ambulatory Visit: Payer: Self-pay

## 2019-05-16 DIAGNOSIS — Z20822 Contact with and (suspected) exposure to covid-19: Secondary | ICD-10-CM

## 2019-05-17 LAB — NOVEL CORONAVIRUS, NAA: SARS-CoV-2, NAA: NOT DETECTED

## 2019-08-21 ENCOUNTER — Ambulatory Visit: Payer: Self-pay | Attending: Internal Medicine

## 2020-09-10 ENCOUNTER — Other Ambulatory Visit: Payer: Self-pay | Admitting: Sports Medicine

## 2020-09-17 ENCOUNTER — Encounter (HOSPITAL_BASED_OUTPATIENT_CLINIC_OR_DEPARTMENT_OTHER): Payer: Self-pay | Admitting: Orthopedic Surgery

## 2020-09-17 ENCOUNTER — Other Ambulatory Visit: Payer: Self-pay

## 2020-09-20 ENCOUNTER — Other Ambulatory Visit (HOSPITAL_COMMUNITY): Payer: Self-pay

## 2020-09-24 ENCOUNTER — Ambulatory Visit (HOSPITAL_BASED_OUTPATIENT_CLINIC_OR_DEPARTMENT_OTHER): Admission: RE | Admit: 2020-09-24 | Payer: 59 | Source: Home / Self Care | Admitting: Orthopedic Surgery

## 2020-09-24 SURGERY — ARTHROSCOPY, KNEE, WITH MEDIAL MENISCECTOMY
Anesthesia: Choice | Site: Knee | Laterality: Right

## 2021-09-15 DIAGNOSIS — Z13220 Encounter for screening for lipoid disorders: Secondary | ICD-10-CM | POA: Diagnosis not present

## 2021-09-15 DIAGNOSIS — Z0000 Encounter for general adult medical examination without abnormal findings: Secondary | ICD-10-CM | POA: Diagnosis not present

## 2021-11-26 ENCOUNTER — Telehealth: Payer: Self-pay | Admitting: Physician Assistant

## 2021-11-26 NOTE — Telephone Encounter (Signed)
Scheduled appt per 5/3 referral. Pt is aware of appt date and time. Pt is aware to arrive 15 mins prior to appt time and to bring and updated insurance card. Pt is aware of appt location.   ?

## 2021-12-10 ENCOUNTER — Other Ambulatory Visit: Payer: Self-pay

## 2021-12-10 ENCOUNTER — Encounter: Payer: Self-pay | Admitting: Physician Assistant

## 2021-12-11 NOTE — Progress Notes (Signed)
Stacey Bennett [phone]   Fax:([phone]) [place] NOTE  Patient Care Team: Donald Prose, MD as PCP - General (Family Medicine)  CHIEF COMPLAINTS/PURPOSE OF CONSULTATION:  Bruising   HISTORY OF PRESENTING ILLNESS:  Stacey Bennett 56 y.o. female presents to the clinic for initial evaluation for easy bruising.  She is unaccompanied for this visit.  On exam today, Ms. Stacey Bennett reports she started to notice bruising upper extremities and thighs over the last 12 months.  The bruising occurs every 4 weeks and resolves on its own.  She is an active person and exercises regularly but denies any contact sports.  She denies any dietary restrictions or weight loss.  She denies any GI symptoms including nausea, vomiting, diarrhea or constipation.  She has no signs of active bleeding including hematochezia, melena, heavy menstrual cycles.  She denies fevers, chills, night sweats, shortness of breath, chest pain, cough, joint pain or peripheral edema.  She has no other complaints.  Rest of the 10 point ROS is below.  MEDICAL HISTORY:  Past Medical History:  Diagnosis Date   Ankle pain, left    Basal cell cancer    Fracture, fibula    Hip pain, left    Metatarsalgia    Stress fracture of right ankle    Supraventricular tachycardia (HCC)    Syncope    Tenosynovitis of foot and ankle     SURGICAL HISTORY: Past Surgical History:  Procedure Laterality Date   CESAREAN SECTION     MOHS SURGERY     WISDOM TOOTH EXTRACTION      SOCIAL HISTORY: Social History   Socioeconomic History   Marital status: Married    Spouse name: Not on file   Number of children: Not on file   Years of education: Not on file   Highest education level: Not on file  Occupational History   Not on file  Tobacco Use   Smoking status: Never   Smokeless tobacco: Never  Substance and Sexual Activity   Alcohol use: Not Currently    Alcohol/week: 5.0 standard drinks    Types: 5  Standard drinks or equivalent per week   Drug use: No   Sexual activity: Yes    Birth control/protection: None  Other Topics Concern   Not on file  Social History Narrative      Married and has one 54 year old boy.          Lives with Stacey Bennett---sig other   Social Determinants of Health   Financial Resource Strain: Not on file  Food Insecurity: Not on file  Transportation Needs: Not on file  Physical Activity: Not on file  Stress: Not on file  Social Connections: Not on file  Intimate Partner Violence: Not on file    FAMILY HISTORY: History reviewed. No pertinent family history.  ALLERGIES:  has No Known Allergies.  MEDICATIONS:  No current outpatient medications on file.   No current facility-administered medications for this visit.    REVIEW OF SYSTEMS:   Constitutional: ( - ) fevers, ( - )  chills , ( - ) night sweats Eyes: ( - ) blurriness of vision, ( - ) double vision, ( - ) watery eyes Ears, nose, mouth, throat, and face: ( - ) mucositis, ( - ) sore throat Respiratory: ( - ) cough, ( - ) dyspnea, ( - ) wheezes Cardiovascular: ( - ) palpitation, ( - ) chest discomfort, ( - ) lower extremity swelling Gastrointestinal:  ( - )  nausea, ( - ) heartburn, ( - ) change in bowel habits Skin: ( - ) abnormal skin rashes Lymphatics: ( - ) new lymphadenopathy, ( + ) easy bruising Neurological: ( - ) numbness, ( - ) tingling, ( - ) new weaknesses Behavioral/Psych: ( - ) mood change, ( - ) new changes  All other systems were reviewed with the patient and are negative.  PHYSICAL EXAMINATION: ECOG PERFORMANCE STATUS: 1 - Symptomatic but completely ambulatory  Vitals:   12/12/21 1110  BP: 104/73  Pulse: 67  Resp: 20  Temp: (!) 97.5 F (36.4 C)  SpO2: 100%   Filed Weights   12/12/21 1110  Weight: 144 lb 14.4 oz (65.7 kg)    GENERAL: well appearing female in NAD  SKIN: skin color, texture, turgor are normal, no rashes or significant lesions EYES: conjunctiva are pink  and non-injected, sclera clear OROPHARYNX: no exudate, no erythema; lips, buccal mucosa, and tongue normal  LYMPH:  no palpable lymphadenopathy in the cervical or supraclavicular lymph nodes.  LUNGS: clear to auscultation and percussion with normal breathing effort HEART: regular rate & rhythm and no murmurs and no lower extremity edema ABDOMEN: soft, non-tender, non-distended, normal bowel sounds Musculoskeletal: no cyanosis of digits and no clubbing  PSYCH: alert & oriented x 3, fluent speech NEURO: no focal motor/sensory deficits  LABORATORY DATA:  I have reviewed the data as listed     View : No data to display.             Latest Ref Rng & Units 01/10/2008    9:06 AM  CMP  Glucose 70 - 99 mg/dL 84    BUN 6 - 23 mg/dL 11    Creatinine 0.4 - 1.2 mg/dL 0.8    Sodium 135 - 145 meq/L 139    Potassium 3.5 - 5.1 meq/L 5.2    Chloride 96 - 112 meq/L 105    CO2 19 - 32 meq/L 27    Calcium 8.4 - 10.5 mg/dL 9.3      ASSESSMENT & PLAN Stacey Bennett is a 56 y.o. who presents to the clinic for evaluation of easy bruising.  Patient denies any active bleeding and reports bruising generally affects her extremities which is very reassuring.  She does not take any medications at this time.  We recommend to proceed with serologic work-up to check CBC, CMP, PT/INR and PTT levels.  If our work-up today is unremarkable, recommend to monitor as bruising is mild and involving common areas.   Orders Placed This Encounter  Procedures   CBC with Differential ([place] Only)    Standing Status:   Future    Standing Expiration Date:   12/12/2022   CMP ([facility] only)    Standing Status:   Future    Standing Expiration Date:   12/12/2022   Protime-INR    Standing Status:   Future    Standing Expiration Date:   12/12/2022   APTT    Standing Status:   Future    Standing Expiration Date:   12/11/2022    All questions were answered. The patient knows to call the clinic with any problems,  questions or concerns.  I have spent a total of 60 minutes minutes of face-to-face and non-face-to-face time, preparing to see the patient, obtaining and/or reviewing separately obtained history, performing a medically appropriate examination, counseling and educating the patient, ordering tests, documenting clinical information in the electronic health record, independently interpreting results and communicating results to  the patient, and care coordination.   Dede Query, PA-C Department of Hematology/Oncology [place] at [facility] Phone: [phone]  Patient was seen with Dr. Lorenso Courier  I have read the above note and personally examined the patient. I agree with the assessment and plan as noted above.  Briefly Stacey Bennett is a 56 year old female who presents for evaluation of easy bruising.  At this time she appears to have small bruises developing on her extremities.  She denies any bruising of the chest or abdomen.  She does not have any overt signs of bleeding such as nosebleeds, gum bleeding, or blood in her stool/urine.  At this time the findings are reassuring and do not likely represent an underlying coagulation disorder.  We will order full coagulation panels in the interest of thoroughness.  The patient voiced understanding of the plan moving forward.   Ledell Peoples, MD Department of Hematology/Oncology [place] at [facility] Phone: [phone] Pager: [phone] Email: [email or web address]

## 2021-12-12 ENCOUNTER — Telehealth: Payer: Self-pay

## 2021-12-12 ENCOUNTER — Inpatient Hospital Stay: Payer: 59

## 2021-12-12 ENCOUNTER — Other Ambulatory Visit: Payer: Self-pay

## 2021-12-12 ENCOUNTER — Inpatient Hospital Stay: Payer: 59 | Attending: Physician Assistant | Admitting: Physician Assistant

## 2021-12-12 ENCOUNTER — Encounter: Payer: Self-pay | Admitting: Physician Assistant

## 2021-12-12 VITALS — BP 104/73 | HR 67 | Temp 97.5°F | Resp 20 | Wt 144.9 lb

## 2021-12-12 DIAGNOSIS — T148XXA Other injury of unspecified body region, initial encounter: Secondary | ICD-10-CM

## 2021-12-12 DIAGNOSIS — R233 Spontaneous ecchymoses: Secondary | ICD-10-CM | POA: Insufficient documentation

## 2021-12-12 LAB — CMP (CANCER CENTER ONLY)
ALT: 24 U/L (ref 0–44)
AST: 19 U/L (ref 15–41)
Albumin: 4.7 g/dL (ref 3.5–5.0)
Alkaline Phosphatase: 66 U/L (ref 38–126)
Anion gap: 6 (ref 5–15)
BUN: 15 mg/dL (ref 6–20)
CO2: 28 mmol/L (ref 22–32)
Calcium: 9.4 mg/dL (ref 8.9–10.3)
Chloride: 105 mmol/L (ref 98–111)
Creatinine: 0.75 mg/dL (ref 0.44–1.00)
GFR, Estimated: >60 mL/min (ref >60)
Glucose, Bld: 90 mg/dL (ref 70–99)
Potassium: 3.9 mmol/L (ref 3.5–5.1)
Sodium: 139 mmol/L (ref 135–145)
Total Bilirubin: 0.6 mg/dL (ref 0.3–1.2)
Total Protein: 7.9 g/dL (ref 6.5–8.1)

## 2021-12-12 LAB — APTT: aPTT: 25 seconds (ref 24–36)

## 2021-12-12 LAB — CBC WITH DIFFERENTIAL (CANCER CENTER ONLY)
Abs Immature Granulocytes: 0.01 10*3/uL (ref 0.00–0.07)
Basophils Absolute: 0.1 10*3/uL (ref 0.0–0.1)
Basophils Relative: 1 %
Eosinophils Absolute: 0.1 10*3/uL (ref 0.0–0.5)
Eosinophils Relative: 2 %
HCT: 40.4 % (ref 36.0–46.0)
Hemoglobin: 13.7 g/dL (ref 12.0–15.0)
Immature Granulocytes: 0 %
Lymphocytes Relative: 40 %
Lymphs Abs: 2 10*3/uL (ref 0.7–4.0)
MCH: 31.6 pg (ref 26.0–34.0)
MCHC: 33.9 g/dL (ref 30.0–36.0)
MCV: 93.1 fL (ref 80.0–100.0)
Monocytes Absolute: 0.5 10*3/uL (ref 0.1–1.0)
Monocytes Relative: 10 %
Neutro Abs: 2.4 10*3/uL (ref 1.7–7.7)
Neutrophils Relative %: 47 %
Platelet Count: 219 10*3/uL (ref 150–400)
RBC: 4.34 MIL/uL (ref 3.87–5.11)
RDW: 12.7 % (ref 11.5–15.5)
WBC Count: 5.1 10*3/uL (ref 4.0–10.5)
nRBC: 0 % (ref 0.0–0.2)

## 2021-12-12 LAB — PROTIME-INR
INR: 1 (ref 0.8–1.2)
Prothrombin Time: 12.7 seconds (ref 11.4–15.2)

## 2021-12-12 NOTE — Telephone Encounter (Signed)
-----   Message from Lincoln Brigham, PA-C sent at 12/12/2021  2:10 PM EDT ----- Please notify patient that labs are normal. No need for further workup. Continue to monitor  bruising or return to clinic as needed.

## 2021-12-12 NOTE — Telephone Encounter (Signed)
LM with lab results and to f/up if needed

## 2021-12-18 DIAGNOSIS — Z124 Encounter for screening for malignant neoplasm of cervix: Secondary | ICD-10-CM | POA: Diagnosis not present

## 2021-12-18 DIAGNOSIS — R69 Illness, unspecified: Secondary | ICD-10-CM | POA: Diagnosis not present

## 2021-12-18 DIAGNOSIS — Z6823 Body mass index (BMI) 23.0-23.9, adult: Secondary | ICD-10-CM | POA: Diagnosis not present

## 2021-12-18 DIAGNOSIS — Z01419 Encounter for gynecological examination (general) (routine) without abnormal findings: Secondary | ICD-10-CM | POA: Diagnosis not present

## 2021-12-18 DIAGNOSIS — Z0142 Encounter for cervical smear to confirm findings of recent normal smear following initial abnormal smear: Secondary | ICD-10-CM | POA: Diagnosis not present

## 2021-12-18 DIAGNOSIS — Z1231 Encounter for screening mammogram for malignant neoplasm of breast: Secondary | ICD-10-CM | POA: Diagnosis not present

## 2021-12-18 DIAGNOSIS — Z01411 Encounter for gynecological examination (general) (routine) with abnormal findings: Secondary | ICD-10-CM | POA: Diagnosis not present

## 2022-02-27 ENCOUNTER — Ambulatory Visit (INDEPENDENT_AMBULATORY_CARE_PROVIDER_SITE_OTHER): Payer: 59

## 2022-02-27 ENCOUNTER — Encounter: Payer: Self-pay | Admitting: Cardiology

## 2022-02-27 ENCOUNTER — Ambulatory Visit: Payer: 59 | Admitting: Cardiology

## 2022-02-27 VITALS — BP 94/62 | HR 68 | Ht 67.0 in | Wt 147.6 lb

## 2022-02-27 DIAGNOSIS — R072 Precordial pain: Secondary | ICD-10-CM | POA: Diagnosis not present

## 2022-02-27 DIAGNOSIS — Z131 Encounter for screening for diabetes mellitus: Secondary | ICD-10-CM

## 2022-02-27 DIAGNOSIS — R002 Palpitations: Secondary | ICD-10-CM | POA: Diagnosis not present

## 2022-02-27 DIAGNOSIS — R079 Chest pain, unspecified: Secondary | ICD-10-CM | POA: Diagnosis not present

## 2022-02-27 DIAGNOSIS — Z79899 Other long term (current) drug therapy: Secondary | ICD-10-CM

## 2022-02-27 LAB — BASIC METABOLIC PANEL
CO2: 26 mmol/L (ref 20–29)
Glucose: 89 mg/dL (ref 70–99)
eGFR: 95 mL/min/{1.73_m2} (ref >59)

## 2022-02-27 NOTE — Progress Notes (Signed)
Cardiology Office Note:    Date:  02/27/2022   ID:  Stacey Bennett, DOB 02/16/1966, MRN [id number]  PCP:  Donald Prose, MD  Cardiologist:  Berniece Salines, DO  Electrophysiologist:  None   Referring MD: Donald Prose, MD   " I am having chest pain "  History of Present Illness:    Stacey Bennett is a 56 y.o. female with a hx of previously diagnosed SVT, family history of diabetes in her brother here today to be evaluated for intermittent chest discomfort.  She tells me that she has been experiencing intermittent chest pain.  She described it as it [place] that happens only at nighttime.  She notes that it is midsternal and has been going on for over a year now.  She denies any radiation. She notes that she has had some episodes of snoring but she denies any fatigue or daytime somnolence.   Past Medical History:  Diagnosis Date   Ankle pain, left    Basal cell cancer    Fracture, fibula    Hip pain, left    Metatarsalgia    Stress fracture of right ankle    Supraventricular tachycardia (HCC)    Syncope    Tenosynovitis of foot and ankle     Past Surgical History:  Procedure Laterality Date   CESAREAN SECTION     MOHS SURGERY     WISDOM TOOTH EXTRACTION      Current Medications: No outpatient medications have been marked as taking for the 02/27/22 encounter (Office Visit) with Berniece Salines, DO.     Allergies:   Patient has no known allergies.   Social History   Socioeconomic History   Marital status: Married    Spouse name: Not on file   Number of children: Not on file   Years of education: Not on file   Highest education level: Not on file  Occupational History   Not on file  Tobacco Use   Smoking status: Never   Smokeless tobacco: Never  Substance and Sexual Activity   Alcohol use: Not Currently    Alcohol/week: 5.0 standard drinks of alcohol    Types: 5 Standard drinks or equivalent per week   Drug use: No   Sexual activity: Yes    Birth control/protection: None   Other Topics Concern   Not on file  Social History Narrative      Married and has one 54 year old boy.          Lives with Lyn---sig other   Social Determinants of Health   Financial Resource Strain: Not on file  Food Insecurity: Not on file  Transportation Needs: Not on file  Physical Activity: Not on file  Stress: Not on file  Social Connections: Not on file     Family History: The patient's family history is not on file.  ROS:   Review of Systems  Constitution: Negative for decreased appetite, fever and weight gain.  HENT: Negative for congestion, ear discharge, hoarse voice and sore throat.   Eyes: Negative for discharge, redness, vision loss in right eye and visual halos.  Cardiovascular: Negative for chest pain, dyspnea on exertion, leg swelling, orthopnea and palpitations.  Respiratory: Negative for cough, hemoptysis, shortness of breath and snoring.   Endocrine: Negative for heat intolerance and polyphagia.  Hematologic/Lymphatic: Negative for bleeding problem. Does not bruise/bleed easily.  Skin: Negative for flushing, nail changes, rash and suspicious lesions.  Musculoskeletal: Negative for arthritis, joint pain, muscle cramps, myalgias, neck  pain and stiffness.  Gastrointestinal: Negative for abdominal pain, bowel incontinence, diarrhea and excessive appetite.  Genitourinary: Negative for decreased libido, genital sores and incomplete emptying.  Neurological: Negative for brief paralysis, focal weakness, headaches and loss of balance.  Psychiatric/Behavioral: Negative for altered mental status, depression and suicidal ideas.  Allergic/Immunologic: Negative for HIV exposure and persistent infections.    EKGs/Labs/Other Studies Reviewed:    The following studies were reviewed today:   EKG:  The ekg ordered today demonstrates sinus rhythm, heart rate 80 beats a minute with T wave inversions suggestive of inferior wall ischemia.  Recent Labs: 12/12/2021: ALT  24; BUN 15; Creatinine 0.75; Hemoglobin 13.7; Platelet Count 219; Potassium 3.9; Sodium 139  Recent Lipid Panel No results found for: "CHOL", "TRIG", "HDL", "CHOLHDL", "VLDL", "LDLCALC", "LDLDIRECT"  Physical Exam:    VS:  BP 94/62 (BP Location: Right Arm, Patient Position: Sitting, Cuff Size: Normal)   Pulse 68   Ht '5\' 7"'$  (1.702 m)   Wt 147 lb 9.6 oz (67 kg)   LMP 06/05/2012   SpO2 99%   BMI 23.12 kg/m     Wt Readings from Last 3 Encounters:  02/27/22 147 lb 9.6 oz (67 kg)  12/12/21 144 lb 14.4 oz (65.7 kg)  10/20/12 130 lb (59 kg)     GEN: Well nourished, well developed in no acute distress HEENT: Normal NECK: No JVD; No carotid bruits LYMPHATICS: No lymphadenopathy CARDIAC: S1S2 noted,RRR, no murmurs, rubs, gallops RESPIRATORY:  Clear to auscultation without rales, wheezing or rhonchi  ABDOMEN: Soft, non-tender, non-distended, +bowel sounds, no guarding. EXTREMITIES: No edema, No cyanosis, no clubbing MUSCULOSKELETAL:  No deformity  SKIN: Warm and dry NEUROLOGIC:  Alert and oriented x 3, non-focal PSYCHIATRIC:  Normal affect, good insight  ASSESSMENT:    1. Chest pain of uncertain etiology   2. Palpitations   3. Precordial pain   4. Screening for diabetes mellitus (DM)   5. Medication management    PLAN:    The symptoms chest pain is concerning, this patient does have intermediate risk for coronary artery disease and at this time I would like to pursue an ischemic evaluation in this patient.  Shared decision a coronary CTA at this time is appropriate.  I have discussed with the patient about the testing.  The patient has no IV contrast allergy and is agreeable to proceed with this test.  I would like to rule out a cardiovascular etiology of this palpitation, therefore at this time I would like to placed a zio patch for  14  days.   We will screen her for diabetes today.  She has not been screened  Once these testing have been performed amd reviewed further  reccomendations will be made. For now, I do reccomend that the patient goes to the nearest ED if  symptoms recur.  The patient is in agreement with the above plan. The patient left the office in stable condition.  The patient will follow up in 1 year or sooner if needed.   Medication Adjustments/Labs and Tests Ordered: Current medicines are reviewed at length with the patient today.  Concerns regarding medicines are outlined above.  Orders Placed This Encounter  Procedures   CT CORONARY MORPH W/CTA COR W/SCORE W/CA W/CM &/OR WO/CM   Basic Metabolic Panel (BMET)   Magnesium   HgB A1c   LONG TERM MONITOR (3-14 DAYS)   EKG 12-Lead   No orders of the defined types were placed in this encounter.   Patient Instructions  Medication Instructions:  Your physician recommends that you continue on your current medications as directed. Please refer to the Current Medication list given to you today.  *If you need a refill on your cardiac medications before your next appointment, please call your pharmacy*   Lab Work: TODAY: BMET, Mag, HgbA1c If you have labs (blood work) drawn today and your tests are completely normal, you will receive your results only by: [place] (if you have MyChart) OR A paper copy in the mail If you have any lab test that is abnormal or we need to change your treatment, we will call you to review the results.   Testing/Procedures: [name]- Long Term Monitor Instructions  Your physician has requested you wear a ZIO patch monitor for 14 days.  This is a single patch monitor. [employer] supplies one patch monitor per enrollment. Additional stickers are not available. Please do not apply patch if you will be having a Nuclear Stress Test,  Echocardiogram, Cardiac CT, MRI, or Chest Xray during the period you would be wearing the  monitor. The patch cannot be worn during these tests. You cannot remove and re-apply the  ZIO XT patch monitor.  Your ZIO patch monitor will  be mailed 3 day USPS to your address on file. It may take 3-5 days  to receive your monitor after you have been enrolled.  Once you have received your monitor, please review the enclosed instructions. Your monitor  has already been registered assigning a specific monitor serial # to you.  Billing and Patient Assistance Program Information  We have supplied [employer] with any of your insurance information on file for billing purposes. [employer] offers a sliding scale Patient Assistance Program for patients that do not have  insurance, or whose insurance does not completely cover the cost of the ZIO monitor.  You must apply for the Patient Assistance Program to qualify for this discounted rate.  To apply, please call [employer] at [phone], select option 4, select option 2, ask to apply for  Patient Assistance Program. [name] will ask your household income, and how many people  are in your household. They will quote your out-of-pocket cost based on that information.  [employer] will also be able to set up a 78-month interest-free payment plan if needed.  Applying the monitor   Shave hair from upper left chest.  Hold abrader disc by orange tab. Rub abrader in 40 strokes over the upper left chest as  indicated in your monitor instructions.  Clean area with 4 enclosed alcohol pads. Let dry.  Apply patch as indicated in monitor instructions. Patch will be placed under collarbone on left  side of chest with arrow pointing upward.  Rub patch adhesive wings for 2 minutes. Remove white label marked "1". Remove the white  label marked "2". Rub patch adhesive wings for 2 additional minutes.  While looking in a mirror, press and release button in center of patch. A small green light will  flash 3-4 times. This will be your only indicator that the monitor has been turned on.  Do not shower for the first 24 hours. You may shower after the first 24 hours.  Press the button if you feel a symptom. You will  hear a small click. Record Date, Time and  Symptom in the Patient Logbook.  When you are ready to remove the patch, follow instructions on the last 2 pages of Patient  Logbook. Stick patch monitor onto the last page of Patient Logbook.  Place Patient Logbook in the blue and white box. Use locking tab on box and tape box closed  securely. The blue and white box has prepaid postage on it. Please place it in the mailbox as  soon as possible. Your physician should have your test results approximately 7 days after the  monitor has been mailed back to [facility].  Call [place] at [phone] if you have questions regarding  your ZIO XT patch monitor. Call them immediately if you see an orange light blinking on your  monitor.  If your monitor falls off in less than 4 days, contact our Monitor department at [phone].  If your monitor becomes loose or falls off after 4 days call [employer] at [phone] for  suggestions on securing your monitor    Your cardiac CT will be scheduled at one of the below locations:   [facility] [address] [place], [place] [address] [phone]  If scheduled at [facility], please arrive at the [facility] and Children's Entrance (Entrance C2) of [facility] 30 minutes prior to test start time. You can use the FREE valet parking offered at entrance C (encouraged to control the heart rate for the test)  Proceed to the [facility] Radiology Department (first floor) to check-in and test prep.  All radiology patients and guests should use entrance C2 at [facility], accessed from [facility], even though the hospital's physical address listed is [address].     Please follow these instructions carefully (unless otherwise directed):   On the Night Before the Test: Be sure to Drink plenty of water. Do not consume any caffeinated/decaffeinated beverages or chocolate 12  hours prior to your test. Do not take any antihistamines 12 hours prior to your test.  On the Day of the Test: Drink plenty of water until 1 hour prior to the test. Do not eat any food 4 hours prior to the test. You may take your regular medications prior to the test.  FEMALES- please wear underwire-free bra if available, avoid dresses & tight clothing  After the Test: Drink plenty of water. After receiving IV contrast, you may experience a mild flushed feeling. This is normal. On occasion, you may experience a mild rash up to 24 hours after the test. This is not dangerous. If this occurs, you can take Benadryl 25 mg and increase your fluid intake. If you experience trouble breathing, this can be serious. If it is severe call 911 IMMEDIATELY. If it is mild, please call our office. If you take any of these medications: Glipizide/Metformin, Avandament, Glucavance, please do not take 48 hours after completing test unless otherwise instructed.  We will call to schedule your test 2-4 weeks out understanding that some insurance companies will need an authorization prior to the service being performed.   For non-scheduling related questions, please contact the cardiac imaging nurse navigator should you have any questions/concerns: Marchia Bond, Cardiac Imaging Nurse Navigator Gordy Clement, Cardiac Imaging Nurse Navigator  Heart and Vascular Services Direct Office Dial: [phone]   For scheduling needs, including cancellations and rescheduling, please call [place], [phone].    Follow-Up: At [facility], you and your health needs are our priority.  As part of our continuing mission to provide you with exceptional heart care, we have created designated Provider Care Teams.  These Care Teams include your primary Cardiologist (physician) and Advanced Practice Providers (APPs -  Physician Assistants and Nurse Practitioners) who  all work together to provide you with the care  you need, when you need it.  We recommend signing up for the patient portal called "MyChart".  Sign up information is provided on this After Visit Summary.  MyChart is used to connect with patients for Virtual Visits (Telemedicine).  Patients are able to view lab/test results, encounter notes, upcoming appointments, etc.  Non-urgent messages can be sent to your provider as well.   To learn more about what you can do with MyChart, go to [email or web address].    Your next appointment:   1 year(s)  The format for your next appointment:   In Person  Provider:   Berniece Salines, DO     Other Instructions   Important Information About Sugar         Adopting a Healthy Lifestyle.  Know what a healthy weight is for you (roughly BMI <25) and aim to maintain this   Aim for 7+ servings of fruits and vegetables daily   65-80+ fluid ounces of water or unsweet tea for healthy kidneys   Limit to max 1 drink of alcohol per day; avoid smoking/tobacco   Limit animal fats in diet for cholesterol and heart health - choose grass fed whenever available   Avoid highly processed foods, and foods high in saturated/trans fats   Aim for low stress - take time to unwind and care for your mental health   Aim for 150 min of moderate intensity exercise weekly for heart health, and weights twice weekly for bone health   Aim for 7-9 hours of sleep daily   When it comes to diets, agreement about the perfect plan isnt easy to find, even among the experts. Experts at the [employer] developed an idea known as the Healthy Eating Plate. Just imagine a plate divided into logical, healthy portions.   The emphasis is on diet quality:   Load up on vegetables and fruits - one-half of your plate: Aim for color and variety, and remember that potatoes dont count.   Go for whole grains - one-quarter of your plate: Whole wheat, barley, wheat berries, quinoa, oats, brown rice, and foods made  with them. If you want pasta, go with whole wheat pasta.   Protein power - one-quarter of your plate: Fish, chicken, beans, and nuts are all healthy, versatile protein sources. Limit red meat.   The diet, however, does go beyond the plate, offering a few other suggestions.   Use healthy plant oils, such as olive, canola, soy, corn, sunflower and peanut. Check the labels, and avoid partially hydrogenated oil, which have unhealthy trans fats.   If youre thirsty, drink water. Coffee and tea are good in moderation, but skip sugary drinks and limit milk and dairy products to one or two daily servings.   The type of carbohydrate in the diet is more important than the amount. Some sources of carbohydrates, such as vegetables, fruits, whole grains, and beans-are healthier than others.   Finally, stay active  Signed, Berniece Salines, DO  02/27/2022 11:10 AM    [place]

## 2022-02-27 NOTE — Patient Instructions (Addendum)
Medication Instructions:  Your physician recommends that you continue on your current medications as directed. Please refer to the Current Medication list given to you today.  *If you need a refill on your cardiac medications before your next appointment, please call your pharmacy*   Lab Work: TODAY: BMET, Mag, HgbA1c If you have labs (blood work) drawn today and your tests are completely normal, you will receive your results only by: [place] (if you have MyChart) OR A paper copy in the mail If you have any lab test that is abnormal or we need to change your treatment, we will call you to review the results.   Testing/Procedures: [name]- Long Term Monitor Instructions  Your physician has requested you wear a ZIO patch monitor for 14 days.  This is a single patch monitor. [employer] supplies one patch monitor per enrollment. Additional stickers are not available. Please do not apply patch if you will be having a Nuclear Stress Test,  Echocardiogram, Cardiac CT, MRI, or Chest Xray during the period you would be wearing the  monitor. The patch cannot be worn during these tests. You cannot remove and re-apply the  ZIO XT patch monitor.  Your ZIO patch monitor will be mailed 3 day USPS to your address on file. It may take 3-5 days  to receive your monitor after you have been enrolled.  Once you have received your monitor, please review the enclosed instructions. Your monitor  has already been registered assigning a specific monitor serial # to you.  Billing and Patient Assistance Program Information  We have supplied [employer] with any of your insurance information on file for billing purposes. [employer] offers a sliding scale Patient Assistance Program for patients that do not have  insurance, or whose insurance does not completely cover the cost of the ZIO monitor.  You must apply for the Patient Assistance Program to qualify for this discounted rate.  To apply, please call [employer] at  [phone], select option 4, select option 2, ask to apply for  Patient Assistance Program. [name] will ask your household income, and how many people  are in your household. They will quote your out-of-pocket cost based on that information.  [employer] will also be able to set up a 62-month interest-free payment plan if needed.  Applying the monitor   Shave hair from upper left chest.  Hold abrader disc by orange tab. Rub abrader in 40 strokes over the upper left chest as  indicated in your monitor instructions.  Clean area with 4 enclosed alcohol pads. Let dry.  Apply patch as indicated in monitor instructions. Patch will be placed under collarbone on left  side of chest with arrow pointing upward.  Rub patch adhesive wings for 2 minutes. Remove white label marked "1". Remove the white  label marked "2". Rub patch adhesive wings for 2 additional minutes.  While looking in a mirror, press and release button in center of patch. A small green light will  flash 3-4 times. This will be your only indicator that the monitor has been turned on.  Do not shower for the first 24 hours. You may shower after the first 24 hours.  Press the button if you feel a symptom. You will hear a small click. Record Date, Time and  Symptom in the Patient Logbook.  When you are ready to remove the patch, follow instructions on the last 2 pages of Patient  Logbook. Stick patch monitor onto the last page of Patient Logbook.  Place  Patient Logbook in the blue and white box. Use locking tab on box and tape box closed  securely. The blue and white box has prepaid postage on it. Please place it in the mailbox as  soon as possible. Your physician should have your test results approximately 7 days after the  monitor has been mailed back to [facility].  Call [place] at [phone] if you have questions regarding  your ZIO XT patch monitor. Call them immediately if you see an orange light  blinking on your  monitor.  If your monitor falls off in less than 4 days, contact our Monitor department at [phone].  If your monitor becomes loose or falls off after 4 days call [employer] at [phone] for  suggestions on securing your monitor    Your cardiac CT will be scheduled at one of the below locations:   [facility] [address] [place], [place] [address] [phone]  If scheduled at [facility], please arrive at the [facility] and Children's Entrance (Entrance C2) of [facility] 30 minutes prior to test start time. You can use the FREE valet parking offered at entrance C (encouraged to control the heart rate for the test)  Proceed to the [facility] Radiology Department (first floor) to check-in and test prep.  All radiology patients and guests should use entrance C2 at [facility], accessed from [facility], even though the hospital's physical address listed is [address]     Please follow these instructions carefully (unless otherwise directed):   On the Night Before the Test: Be sure to Drink plenty of water. Do not consume any caffeinated/decaffeinated beverages or chocolate 12 hours prior to your test. Do not take any antihistamines 12 hours prior to your test.  On the Day of the Test: Drink plenty of water until 1 hour prior to the test. Do not eat any food 4 hours prior to the test. You may take your regular medications prior to the test.  FEMALES- please wear underwire-free bra if available, avoid dresses & tight clothing  After the Test: Drink plenty of water. After receiving IV contrast, you may experience a mild flushed feeling. This is normal. On occasion, you may experience a mild rash up to 24 hours after the test. This is not dangerous. If this occurs, you can take Benadryl 25 mg and increase your fluid intake. If you experience trouble breathing, this can be serious. If it is  severe call 911 IMMEDIATELY. If it is mild, please call our office. If you take any of these medications: Glipizide/Metformin, Avandament, Glucavance, please do not take 48 hours after completing test unless otherwise instructed.  We will call to schedule your test 2-4 weeks out understanding that some insurance companies will need an authorization prior to the service being performed.   For non-scheduling related questions, please contact the cardiac imaging nurse navigator should you have any questions/concerns: Marchia Bond, Cardiac Imaging Nurse Navigator Gordy Clement, Cardiac Imaging Nurse Navigator [facility] Direct Office Dial: [phone]   For scheduling needs, including cancellations and rescheduling, please call [place], [phone].    Follow-Up: At [facility], you and your health needs are our priority.  As part of our continuing mission to provide you with exceptional heart care, we have created designated Provider Care Teams.  These Care Teams include your primary Cardiologist (physician) and Advanced Practice Providers (APPs -  Physician Assistants and Nurse Practitioners) who  all work together to provide you with the care you need, when you need it.  We recommend signing up for the patient portal called "MyChart".  Sign up information is provided on this After Visit Summary.  MyChart is used to connect with patients for Virtual Visits (Telemedicine).  Patients are able to view lab/test results, encounter notes, upcoming appointments, etc.  Non-urgent messages can be sent to your provider as well.   To learn more about what you can do with MyChart, go to [email or web address].    Your next appointment:   1 year(s)  The format for your next appointment:   In Person  Provider:   Berniece Salines, DO     Other Instructions   Important Information About Sugar

## 2022-02-27 NOTE — Progress Notes (Unsigned)
ZIO XT - 14 day has been mailed to pt's home address

## 2022-02-28 LAB — BASIC METABOLIC PANEL
BUN/Creatinine Ratio: 24 — ABNORMAL HIGH (ref 9–23)
BUN: 18 mg/dL (ref 6–24)
Calcium: 9.6 mg/dL (ref 8.7–10.2)
Chloride: 103 mmol/L (ref 96–106)
Creatinine, Ser: 0.75 mg/dL (ref 0.57–1.00)
Potassium: 5.4 mmol/L — ABNORMAL HIGH (ref 3.5–5.2)
Sodium: 140 mmol/L (ref 134–144)

## 2022-02-28 LAB — HEMOGLOBIN A1C
Est. average glucose Bld gHb Est-mCnc: 117 mg/dL
Hgb A1c MFr Bld: 5.7 % — ABNORMAL HIGH (ref 4.8–5.6)

## 2022-02-28 LAB — MAGNESIUM: Magnesium: 2.6 mg/dL — ABNORMAL HIGH (ref 1.6–2.3)

## 2022-03-04 DIAGNOSIS — R002 Palpitations: Secondary | ICD-10-CM | POA: Diagnosis not present

## 2022-03-19 ENCOUNTER — Telehealth (HOSPITAL_COMMUNITY): Payer: Self-pay | Admitting: *Deleted

## 2022-03-19 NOTE — Telephone Encounter (Signed)
Attempted to call patient regarding upcoming cardiac CT appointment. °Left message on voicemail with name and callback number ° °Lari Linson RN Navigator Cardiac Imaging °[facility] °[phone] Office °[phone] Cell ° °

## 2022-03-19 NOTE — Telephone Encounter (Signed)
Patient returning call regarding upcoming cardiac imaging study; pt verbalizes understanding of appt date/time, parking situation and where to check in, pre-test NPO status, and verified current allergies; name and call back number provided for further questions should they arise  Gordy Clement RN Navigator Cardiac [place] [facility] [phone] office [phone] cell  Patient is aware we may call to r/s since her insurance is still pending. Otherwise she will arrive at 8:30am.

## 2022-03-20 ENCOUNTER — Ambulatory Visit (HOSPITAL_COMMUNITY): Admission: RE | Admit: 2022-03-20 | Payer: 59 | Source: Ambulatory Visit

## 2022-03-23 ENCOUNTER — Telehealth (HOSPITAL_COMMUNITY): Payer: Self-pay | Admitting: Emergency Medicine

## 2022-03-23 NOTE — Telephone Encounter (Signed)
Reaching out to patient to offer assistance regarding upcoming cardiac imaging study; pt verbalizes understanding of appt date/time, parking situation and where to check in, pre-test NPO status and medications ordered, and verified current allergies; name and call back number provided for further questions should they arise Stacey Bond RN Navigator Cardiac Imaging [facility] [phone] office [phone] cell  Arrival 230 w/c entrance Denies iv issues No pre meds, PO hydration to support BP  Aware of nitro

## 2022-03-25 ENCOUNTER — Ambulatory Visit (HOSPITAL_COMMUNITY)
Admission: RE | Admit: 2022-03-25 | Discharge: 2022-03-25 | Disposition: A | Discharge status: Home / Self Care | Payer: 59 | Source: Ambulatory Visit | Attending: Cardiology | Admitting: Cardiology

## 2022-03-25 DIAGNOSIS — R072 Precordial pain: Secondary | ICD-10-CM | POA: Insufficient documentation

## 2022-03-25 MED ORDER — NITROGLYCERIN 0.4 MG SL SUBL
SUBLINGUAL_TABLET | SUBLINGUAL | Status: AC
  Filled 2022-03-25: qty 2

## 2022-03-25 MED ORDER — NITROGLYCERIN 0.4 MG SL SUBL
0.8000 mg | SUBLINGUAL_TABLET | Freq: Once | SUBLINGUAL | Status: AC
  Administered 2022-03-25: 0.8 mg via SUBLINGUAL

## 2022-03-25 MED ORDER — IOHEXOL 350 MG/ML SOLN
100.0000 mL | Freq: Once | INTRAVENOUS | Status: AC | PRN
  Administered 2022-03-25: 100 mL via INTRAVENOUS

## 2022-03-26 DIAGNOSIS — R002 Palpitations: Secondary | ICD-10-CM | POA: Diagnosis not present

## 2022-03-31 ENCOUNTER — Encounter: Payer: Self-pay | Admitting: Cardiology

## 2022-03-31 ENCOUNTER — Other Ambulatory Visit: Payer: Self-pay

## 2022-03-31 MED ORDER — ROSUVASTATIN CALCIUM 10 MG PO TABS: 10.0000 mg | ORAL_TABLET | Freq: Every day | ORAL | 3 refills | Status: DC

## 2022-03-31 NOTE — Progress Notes (Signed)
Prescription sent to pharmacy.

## 2022-04-01 NOTE — Telephone Encounter (Signed)
Pt added to Dr. Terrial Rhodes schedule for [place] visit.

## 2022-04-01 NOTE — Telephone Encounter (Signed)
Called pt to set up the appointment. No answer at this time. Left message for her to return the call.

## 2022-04-03 ENCOUNTER — Other Ambulatory Visit (HOSPITAL_COMMUNITY): Payer: 59

## 2022-04-15 ENCOUNTER — Encounter: Payer: Self-pay | Admitting: Cardiology

## 2022-04-15 ENCOUNTER — Ambulatory Visit: Payer: 59 | Attending: Cardiology | Admitting: Cardiology

## 2022-04-15 VITALS — Ht 67.0 in | Wt 143.0 lb

## 2022-04-15 DIAGNOSIS — E782 Mixed hyperlipidemia: Secondary | ICD-10-CM | POA: Diagnosis not present

## 2022-04-15 DIAGNOSIS — I2510 Atherosclerotic heart disease of native coronary artery without angina pectoris: Secondary | ICD-10-CM | POA: Diagnosis not present

## 2022-04-15 DIAGNOSIS — I471 Supraventricular tachycardia: Secondary | ICD-10-CM

## 2022-04-15 DIAGNOSIS — R7303 Prediabetes: Secondary | ICD-10-CM

## 2022-04-15 NOTE — Progress Notes (Signed)
Virtual Visit via Video Note   Because of Marty Stacey Bennett co-morbid illnesses, she is at least at moderate risk for complications without adequate follow up.  This format is felt to be most appropriate for this patient at this time.  All issues noted in this document were discussed and addressed.  A limited physical exam was performed with this format.  Please refer to the patient's chart for her consent to telehealth for [facility].      Date:  04/15/2022   ID:  Bartley Bing, DOB [birth year], MRN [id number]  Patient Location: Home Provider Location: Office/Clinic  PCP:  Donald Prose, MD  Cardiologist:  Berniece Salines, DO  Electrophysiologist:  None   Evaluation Performed:  Follow-Up Visit  Chief Complaint:  " I would like to discuss when testing result"  History of Present Illness:    SHIANN KAM is a 56 y.o. female with minimal coronary artery disease seen on coronary CTA recently, hyperlipidemia, prediabetes from recent screening hemoglobin A1c 5.7,.  Symptomatic paroxysmal SVT here today to discuss her testing results.  I initially saw the patient in February 28, 2019.  At that time she was experiencing some chest discomfort  Given her family history and risk factors I recommended she undergo a coronary CT scan.  She also has been experiencing palpitations we will place a monitor on the patient.  In the interim she was able to get this testing done.  She is here today to discuss her testing result.   The patient does not have symptoms concerning for COVID-19 infection (fever, chills, cough, or new shortness of breath).    Past Medical History:  Diagnosis Date   Ankle pain, left    Basal cell cancer    Fracture, fibula    Hip pain, left    Metatarsalgia    Stress fracture of right ankle    Supraventricular tachycardia (HCC)    Syncope    Tenosynovitis of foot and ankle    Past Surgical History:  Procedure Laterality Date   CESAREAN SECTION     MOHS SURGERY      WISDOM TOOTH EXTRACTION       Current Meds  Medication Sig   rosuvastatin (CRESTOR) 10 MG tablet Take 1 tablet (10 mg total) by mouth daily.     Allergies:   Patient has no known allergies.   Social History   Tobacco Use   Smoking status: Never   Smokeless tobacco: Never  Substance Use Topics   Alcohol use: Not Currently    Alcohol/week: 5.0 standard drinks of alcohol    Types: 5 Standard drinks or equivalent per week   Drug use: No     Family Hx: The patient's family history is not on file.  ROS:   Please see the history of present illness.     All other systems reviewed and are negative.   Prior CV studies:   The following studies were reviewed today:  Zio Monitor Patch Wear Time:  14 days and 0 hours (2023-08-09T08:07:53-0400 to 2023-08-23T08:07:57-0400)   Patient had a min HR of 48 bpm, max HR of 169 bpm, and avg HR of 82 bpm. Predominant underlying rhythm was Sinus Rhythm.  5 Supraventricular Tachycardia runs occurred, the run with the fastest interval lasting 16 beats with a max rate of 169 bpm (avg 130 bpm); the run with the fastest interval was also the longest. Supraventricular Tachycardia was detected within +/- 45 seconds of symptomatic patient event(s). Isolated  SVEs were rare (<1.0%), SVE Couplets were rare (<1.0%), and SVE Triplets were rare (<1.0%). Isolated VEs were rare (<1.0%), and no VE Couplets or VE Triplets were present.    Symptoms associated with paroxysmal supraventricular tachycardia.    Conclusion: This study shows evidence of symptomatic paroxysmal supraventricular tachycardia.  CCTA 03/25/2022 Calcium Score: Minimal calcium noted in LAD   Coronary Arteries: Right dominant with no anomalies   LM: Normal   LAD: 1-24% calcified plaque in mid vessel   D1: Normal   D2: Normal   D3:  Normal   D4: Normal   Circumflex: Normal   OM1: Normal   OM2: Normal   RCA:  Normal   PDA: Normal   PLA: Normal   IMPRESSION: 1. Calcium  score 3.97 isolated to LAD this is 80 th percentile for age/sex   2.  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  CAD RADS 1 non obstructive CAD see description above   Jenkins Rouge   Electronically Signed: By: Jenkins Rouge M.D. On: 03/25/2022 15:40  Labs/Other Tests and Data Reviewed:    EKG:  None today  Recent Labs: 12/12/2021: ALT 24; Hemoglobin 13.7; Platelet Count 219 02/27/2022: BUN 18; Creatinine, Ser 0.75; Magnesium 2.6; Potassium 5.4; Sodium 140   Recent Lipid Panel No results found for: "CHOL", "TRIG", "HDL", "CHOLHDL", "LDLCALC", "LDLDIRECT"  Wt Readings from Last 3 Encounters:  04/15/22 143 lb (64.9 kg)  02/27/22 147 lb 9.6 oz (67 kg)  12/12/21 144 lb 14.4 oz (65.7 kg)     Objective:    Vital Signs:  Ht '5\' 7"'$  (1.702 m)   Wt 143 lb (64.9 kg)   LMP 06/05/2012   BMI 22.40 kg/m      ASSESSMENT & PLAN:    Minimal coronary artery disease Hyperlipidemia Prediabetes Symptomatic paroxysmal SVT  We had a very long discussion about her testing results mainly focusing on the coronary CT scan with what it means to have minimal coronary artery disease.  I have discussed with the patient that given the minimal coronary artery disease, her LDL goal should be less than 70 as well as she also has prediabetes dose changes her risk as well.  We talked about the start of statin but at this time she prefers not to use any medicinal intervention.  She like to stick with diet and exercise and natural means.  I explained to the patient that this may not be enough for primary prevention. She had questions about the side effects for statin medications we also able to answer. Pretty much with the ZIO monitor to confirm her known paroxysmal SVT.  She will let me know if she decides that she wants to get started on a statin medication.   The patient is in agreement with the above plan. The patient left the office in stable condition.  The patient will follow up in 1 year or  sooner if needed.  COVID-19 Education: The signs and symptoms of COVID-19 were discussed with the patient and how to seek care for testing (follow up with PCP or arrange E-visit).  The importance of social distancing was discussed today.  Time:   Today, I have spent 16 minutes with the patient with telehealth technology discussing the above problems.     Medication Adjustments/Labs and Tests Ordered: Current medicines are reviewed at length with the patient today.  Concerns regarding medicines are outlined above.   Tests Ordered: No orders of the defined types were placed in this  encounter.   Medication Changes: No orders of the defined types were placed in this encounter.   Follow Up:  In Person in 1 year(s)  Signed, Berniece Salines, DO  04/15/2022 11:30 AM    [facility]

## 2022-04-15 NOTE — Patient Instructions (Signed)
Medication Instructions:  Your physician recommends that you continue on your current medications as directed. Please refer to the Current Medication list given to you today.  *If you need a refill on your cardiac medications before your next appointment, please call your pharmacy*   Lab Work: None   Testing/Procedures: None   Follow-Up: At [facility], you and your health needs are our priority.  As part of our continuing mission to provide you with exceptional heart care, we have created designated Provider Care Teams.  These Care Teams include your primary Cardiologist (physician) and Advanced Practice Providers (APPs -  Physician Assistants and Nurse Practitioners) who all work together to provide you with the care you need, when you need it.  We recommend signing up for the patient portal called "MyChart".  Sign up information is provided on this After Visit Summary.  MyChart is used to connect with patients for Virtual Visits (Telemedicine).  Patients are able to view lab/test results, encounter notes, upcoming appointments, etc.  Non-urgent messages can be sent to your provider as well.   To learn more about what you can do with MyChart, go to [email or web address].    Your next appointment:   1 year(s)  The format for your next appointment:   In Person  Provider:   Berniece Salines, DO     Other Instructions   Important Information About Sugar

## 2022-05-28 ENCOUNTER — Encounter (HOSPITAL_COMMUNITY): Payer: Self-pay

## 2022-05-28 ENCOUNTER — Ambulatory Visit (HOSPITAL_COMMUNITY)
Admission: EM | Admit: 2022-05-28 | Discharge: 2022-05-28 | Disposition: A | Discharge status: Home / Self Care | Payer: 59 | Attending: Family Medicine | Admitting: Family Medicine

## 2022-05-28 ENCOUNTER — Ambulatory Visit (INDEPENDENT_AMBULATORY_CARE_PROVIDER_SITE_OTHER): Payer: 59

## 2022-05-28 DIAGNOSIS — J019 Acute sinusitis, unspecified: Secondary | ICD-10-CM

## 2022-05-28 DIAGNOSIS — R0989 Other specified symptoms and signs involving the circulatory and respiratory systems: Secondary | ICD-10-CM | POA: Diagnosis not present

## 2022-05-28 DIAGNOSIS — J4521 Mild intermittent asthma with (acute) exacerbation: Secondary | ICD-10-CM | POA: Diagnosis not present

## 2022-05-28 MED ORDER — PREDNISONE 20 MG PO TABS: 40.0000 mg | ORAL_TABLET | Freq: Every day | ORAL | 0 refills | Status: AC

## 2022-05-28 MED ORDER — CEFDINIR 300 MG PO CAPS: 600.0000 mg | ORAL_CAPSULE | Freq: Every day | ORAL | 0 refills | Status: AC

## 2022-05-28 MED ORDER — ALBUTEROL SULFATE HFA 108 (90 BASE) MCG/ACT IN AERS: 2.0000 | INHALATION_SPRAY | RESPIRATORY_TRACT | 0 refills | Status: DC | PRN

## 2022-05-28 NOTE — ED Provider Notes (Signed)
[place]    CSN: [id number] Arrival date & time: 05/28/22  4166      History   Chief Complaint Chief Complaint  Patient presents with   URI    HPI Stacey Bennett is a 56 y.o. female.    URI  Here for cough and chest congestion.  She began with upper respiratory symptoms with nasal congestion and postnasal drainage for about then on October 30, and having congestion in her chest and has been wheezing.  She has wheezed previously.  No fever or chills or vomiting  Past Medical History:  Diagnosis Date   Ankle pain, left    Basal cell cancer    Fracture, fibula    Hip pain, left    Metatarsalgia    Stress fracture of right ankle    Supraventricular tachycardia    Syncope    Tenosynovitis of foot and ankle     Patient Active Problem List   Diagnosis Date Noted   Bruising 12/12/2021   Ankle pain 08/15/2012   ABNORMALITY OF GAIT 07/09/2009   SUPRAVENTRICULAR TACHYCARDIA 06/07/2009   SYNCOPE 06/07/2009   TENOSYNOVITIS OF FOOT AND ANKLE 04/24/2009   STRESS FRACTURE, RIGHT ANKLE 04/24/2009   Pain in joint, ankle and foot 12/19/2008   FRACTURE, FIBULA 12/19/2008   HIP PAIN, LEFT 10/29/2008   METATARSALGIA 10/29/2008    Past Surgical History:  Procedure Laterality Date   CESAREAN SECTION     MOHS SURGERY     WISDOM TOOTH EXTRACTION      OB History   No obstetric history on file.      Home Medications    Prior to Admission medications   Medication Sig Start Date End Date Taking? Authorizing Provider  albuterol (VENTOLIN HFA) 108 (90 Base) MCG/ACT inhaler Inhale 2 puffs into the lungs every 4 (four) hours as needed for wheezing or shortness of breath. 05/28/22  Yes Barrett Henle, MD  cefdinir (OMNICEF) 300 MG capsule Take 2 capsules (600 mg total) by mouth daily for 7 days. 05/28/22 06/04/22 Yes Aviyanna Colbaugh, Gwenlyn Perking, MD  predniSONE (DELTASONE) 20 MG tablet Take 2 tablets (40 mg total) by mouth daily with breakfast for 5 days. 05/28/22 06/02/22 Yes  Kyley Laurel, Gwenlyn Perking, MD  rosuvastatin (CRESTOR) 10 MG tablet Take 1 tablet (10 mg total) by mouth daily. 03/31/22  Yes Tobb, Kardie, DO    Family History History reviewed. No pertinent family history.  Social History Social History   Tobacco Use   Smoking status: Never   Smokeless tobacco: Never  Substance Use Topics   Alcohol use: Not Currently    Alcohol/week: 5.0 standard drinks of alcohol    Types: 5 Standard drinks or equivalent per week   Drug use: No     Allergies   Patient has no known allergies.   Review of Systems Review of Systems   Physical Exam Triage Vital Signs ED Triage Vitals [05/28/22 0932]  Enc Vitals Group     BP 113/62     Pulse Rate (!) 56     Resp 16     Temp 99.2 F (37.3 C)     Temp Source Oral     SpO2 97 %     Weight      Height      Head Circumference      Peak Flow      Pain Score 0     Pain Loc      Pain Edu?  Excl. in [place]?    No data found.  Updated Vital Signs BP 113/62 (BP Location: Right Arm)   Pulse (!) 56   Temp 99.2 F (37.3 C) (Oral)   Resp 16   LMP 06/05/2012   SpO2 97%   Visual Acuity Right Eye Distance:   Left Eye Distance:   Bilateral Distance:    Right Eye Near:   Left Eye Near:    Bilateral Near:     Physical Exam Vitals reviewed.  Constitutional:      General: She is not in acute distress.    Appearance: She is not ill-appearing, toxic-appearing or diaphoretic.  HENT:     Nose: Nose normal.     Mouth/Throat:     Mouth: Mucous membranes are moist.     Comments: There is some clear exudate in the oropharynx Eyes:     Extraocular Movements: Extraocular movements intact.     Conjunctiva/sclera: Conjunctivae normal.     Pupils: Pupils are equal, round, and reactive to light.  Cardiovascular:     Rate and Rhythm: Normal rate and regular rhythm.     Heart sounds: No murmur heard. Pulmonary:     Effort: Pulmonary effort is normal. No respiratory distress.     Breath sounds: No wheezing,  rhonchi or rales.  Chest:     Chest wall: No tenderness.  Musculoskeletal:     Cervical back: Neck supple.  Lymphadenopathy:     Cervical: No cervical adenopathy.  Skin:    Capillary Refill: Capillary refill takes less than 2 seconds.     Coloration: Skin is not jaundiced or pale.  Neurological:     General: No focal deficit present.     Mental Status: She is alert and oriented to person, place, and time.  Psychiatric:        Behavior: Behavior normal.      UC Treatments / Results  Labs (all labs ordered are listed, but only abnormal results are displayed) Labs Reviewed - No data to display  EKG   Radiology DG Chest 2 View  Result Date: 05/28/2022 CLINICAL DATA:  Chest congestion and heaviness for 3 days EXAM: CHEST - 2 VIEW COMPARISON:  03/08/2019 FINDINGS: The heart size and mediastinal contours are within normal limits. Both lungs are clear. The visualized skeletal structures are unremarkable. IMPRESSION: No active cardiopulmonary disease. Electronically Signed   By: Kathreen Devoid M.D.   On: 05/28/2022 10:35    Procedures Procedures (including critical care time)  Medications Ordered in UC Medications - No data to display  Initial Impression / Assessment and Plan / UC Course  I have reviewed the triage vital signs and the nursing notes.  Pertinent labs & imaging results that were available during my care of the patient were reviewed by me and considered in my medical decision making (see chart for details).        CXR is clear. Meds sent to treat for asthma exacerbation, and also antibiotic sent to treat sinusitis with the duration of her symptoms Final Clinical Impressions(s) / UC Diagnoses   Final diagnoses:  Mild intermittent asthma with (acute) exacerbation  Acute sinusitis, recurrence not specified, unspecified location     Discharge Instructions      Albuterol inhaler--do 2 puffs every 4 hours as needed for shortness of breath or wheezing  Take  cefdinir 300 mg--2 capsules together daily for 7 days  Take prednisone 20 mg--2 daily for 5 days      ED Prescriptions  Medication Sig Dispense Auth. Provider   albuterol (VENTOLIN HFA) 108 (90 Base) MCG/ACT inhaler Inhale 2 puffs into the lungs every 4 (four) hours as needed for wheezing or shortness of breath. 1 each Barrett Henle, MD   cefdinir (OMNICEF) 300 MG capsule Take 2 capsules (600 mg total) by mouth daily for 7 days. 14 capsule Barrett Henle, MD   predniSONE (DELTASONE) 20 MG tablet Take 2 tablets (40 mg total) by mouth daily with breakfast for 5 days. 10 tablet Windy Carina Gwenlyn Perking, MD      PDMP not reviewed this encounter.   Barrett Henle, MD 05/28/22 1052

## 2022-05-28 NOTE — ED Triage Notes (Signed)
Chest Congestion / Cough / Runny Nose / Headache / Fatigue x 10 days. No known sick exposure. States she traveled 2 weeks ends ago to [place].  Productive cough with green and dark yellow production.

## 2022-05-28 NOTE — Discharge Instructions (Signed)
Albuterol inhaler--do 2 puffs every 4 hours as needed for shortness of breath or wheezing  Take cefdinir 300 mg--2 capsules together daily for 7 days  Take prednisone 20 mg--2 daily for 5 days

## 2022-07-13 ENCOUNTER — Telehealth: Payer: Self-pay | Admitting: Cardiology

## 2022-07-13 MED ORDER — ROSUVASTATIN CALCIUM 10 MG PO TABS: 10.0000 mg | ORAL_TABLET | Freq: Every day | ORAL | 3 refills | Status: DC

## 2022-07-13 NOTE — Telephone Encounter (Signed)
*  STAT* If patient is at the pharmacy, call can be transferred to refill team.   1. Which medications need to be refilled? (please list name of each medication and dose if known)   rosuvastatin (CRESTOR) 10 MG tablet    2. Which pharmacy/location (including street and city if local pharmacy) is medication to be sent to?  [id number]- [place], [place] - [address]   3. Do they need a 30 day or 90 day supply? [address]

## 2022-10-14 DIAGNOSIS — Z0000 Encounter for general adult medical examination without abnormal findings: Secondary | ICD-10-CM | POA: Diagnosis not present

## 2022-10-14 DIAGNOSIS — E7800 Pure hypercholesterolemia, unspecified: Secondary | ICD-10-CM | POA: Diagnosis not present

## 2023-06-11 DIAGNOSIS — Z23 Encounter for immunization: Secondary | ICD-10-CM | POA: Diagnosis not present

## 2023-08-14 ENCOUNTER — Other Ambulatory Visit: Payer: Self-pay | Admitting: Cardiology

## 2023-09-22 ENCOUNTER — Other Ambulatory Visit: Payer: Self-pay | Admitting: Cardiology

## 2023-09-22 NOTE — Telephone Encounter (Signed)
 Rx refill sent to pharmacy.

## 2023-10-07 DIAGNOSIS — Z1331 Encounter for screening for depression: Secondary | ICD-10-CM | POA: Diagnosis not present

## 2023-10-07 DIAGNOSIS — Z01419 Encounter for gynecological examination (general) (routine) without abnormal findings: Secondary | ICD-10-CM | POA: Diagnosis not present

## 2023-10-18 ENCOUNTER — Other Ambulatory Visit: Payer: Self-pay | Admitting: Cardiology

## 2023-10-19 ENCOUNTER — Encounter: Payer: Self-pay | Admitting: Cardiology

## 2023-10-20 MED ORDER — ROSUVASTATIN CALCIUM 10 MG PO TABS: 10.0000 mg | ORAL_TABLET | Freq: Every day | ORAL | 2 refills | Status: DC

## 2023-10-29 DIAGNOSIS — Z23 Encounter for immunization: Secondary | ICD-10-CM | POA: Diagnosis not present

## 2023-10-29 DIAGNOSIS — Z2989 Encounter for other specified prophylactic measures: Secondary | ICD-10-CM | POA: Diagnosis not present

## 2023-10-29 DIAGNOSIS — A09 Infectious gastroenteritis and colitis, unspecified: Secondary | ICD-10-CM | POA: Diagnosis not present

## 2023-10-29 DIAGNOSIS — R110 Nausea: Secondary | ICD-10-CM | POA: Diagnosis not present

## 2023-11-26 DIAGNOSIS — Z0000 Encounter for general adult medical examination without abnormal findings: Secondary | ICD-10-CM | POA: Diagnosis not present

## 2023-11-26 DIAGNOSIS — E7800 Pure hypercholesterolemia, unspecified: Secondary | ICD-10-CM | POA: Diagnosis not present

## 2023-11-26 LAB — LAB REPORT - SCANNED
EGFR: 82
TSH: 1.03 (ref 0.41–5.90)

## 2023-12-28 ENCOUNTER — Encounter: Payer: Self-pay | Admitting: Dermatology

## 2023-12-28 ENCOUNTER — Ambulatory Visit (INDEPENDENT_AMBULATORY_CARE_PROVIDER_SITE_OTHER): Admitting: Dermatology

## 2023-12-28 VITALS — BP 113/63 | HR 76

## 2023-12-28 DIAGNOSIS — L821 Other seborrheic keratosis: Secondary | ICD-10-CM

## 2023-12-28 DIAGNOSIS — W908XXA Exposure to other nonionizing radiation, initial encounter: Secondary | ICD-10-CM | POA: Diagnosis not present

## 2023-12-28 DIAGNOSIS — D1801 Hemangioma of skin and subcutaneous tissue: Secondary | ICD-10-CM | POA: Diagnosis not present

## 2023-12-28 DIAGNOSIS — Z85828 Personal history of other malignant neoplasm of skin: Secondary | ICD-10-CM

## 2023-12-28 DIAGNOSIS — L578 Other skin changes due to chronic exposure to nonionizing radiation: Secondary | ICD-10-CM | POA: Diagnosis not present

## 2023-12-28 DIAGNOSIS — D229 Melanocytic nevi, unspecified: Secondary | ICD-10-CM

## 2023-12-28 DIAGNOSIS — Z1283 Encounter for screening for malignant neoplasm of skin: Secondary | ICD-10-CM

## 2023-12-28 DIAGNOSIS — L814 Other melanin hyperpigmentation: Secondary | ICD-10-CM

## 2023-12-28 NOTE — Progress Notes (Signed)
   New Patient Visit   Subjective  Stacey Bennett is a 58 y.o. female who presents for the following: Skin Cancer Screening and Full Body Skin Exam. Hx of BCC.   The patient presents for Total-Body Skin Exam (TBSE) for skin cancer screening and mole check. The patient has spots, moles and lesions to be evaluated, some may be new or changing.  The following portions of the chart were reviewed this encounter and updated as appropriate: medications, allergies, medical history  Review of Systems:  No other skin or systemic complaints except as noted in HPI or Assessment and Plan.  Objective  Well appearing patient in no apparent distress; mood and affect are within normal limits.  A full examination was performed including scalp, head, eyes, ears, nose, lips, neck, chest, axillae, abdomen, back, buttocks, bilateral upper extremities, bilateral lower extremities, hands, feet, fingers, toes, fingernails, and toenails. All findings within normal limits unless otherwise noted below.   Relevant physical exam findings are noted in the Assessment and Plan.    Assessment & Plan   SKIN CANCER SCREENING PERFORMED TODAY.  ACTINIC DAMAGE - Chronic condition, secondary to cumulative UV/sun exposure - diffuse scaly erythematous macules with underlying dyspigmentation - Recommend daily broad spectrum sunscreen SPF 30+ to sun-exposed areas, reapply every 2 hours as needed.  - Staying in the shade or wearing long sleeves, sun glasses (UVA+UVB protection) and wide brim hats (4-inch brim around the entire circumference of the hat) are also recommended for sun protection.  - Call for new or changing lesions.  LENTIGINES, SEBORRHEIC KERATOSES, HEMANGIOMAS - Benign normal skin lesions - Benign-appearing - Call for any changes  MELANOCYTIC NEVI - Tan-brown and/or pink-flesh-colored symmetric macules and papules - Benign appearing on exam today - Observation - Call clinic for new or changing moles -  Recommend daily use of broad spectrum spf 30+ sunscreen to sun-exposed areas.   HISTORY OF BASAL CELL CARCINOMA OF THE SKIN - No evidence of recurrence today - Recommend regular full body skin exams - Recommend daily broad spectrum sunscreen SPF 30+ to sun-exposed areas, reapply every 2 hours as needed.  - Call if any new or changing lesions are noted between office visits   Return in about 1 year (around 12/27/2024) for TBSC.  I, Haig Levan, Surg Tech III, am acting as scribe for Deneise Finlay, MD.   Documentation: I have reviewed the above documentation for accuracy and completeness, and I agree with the above.  Deneise Finlay, MD

## 2023-12-28 NOTE — Patient Instructions (Signed)

## 2024-02-01 ENCOUNTER — Encounter: Payer: Self-pay | Admitting: Cardiology

## 2024-02-01 ENCOUNTER — Ambulatory Visit: Attending: Cardiology | Admitting: Cardiology

## 2024-02-01 VITALS — BP 94/60 | HR 56 | Ht 67.0 in | Wt 150.5 lb

## 2024-02-01 DIAGNOSIS — I471 Supraventricular tachycardia, unspecified: Secondary | ICD-10-CM | POA: Diagnosis not present

## 2024-02-01 DIAGNOSIS — R002 Palpitations: Secondary | ICD-10-CM | POA: Diagnosis not present

## 2024-02-01 DIAGNOSIS — R7303 Prediabetes: Secondary | ICD-10-CM | POA: Diagnosis not present

## 2024-02-01 DIAGNOSIS — R001 Bradycardia, unspecified: Secondary | ICD-10-CM

## 2024-02-01 DIAGNOSIS — I2510 Atherosclerotic heart disease of native coronary artery without angina pectoris: Secondary | ICD-10-CM | POA: Diagnosis not present

## 2024-02-01 DIAGNOSIS — Z7189 Other specified counseling: Secondary | ICD-10-CM | POA: Diagnosis not present

## 2024-02-01 DIAGNOSIS — E782 Mixed hyperlipidemia: Secondary | ICD-10-CM

## 2024-02-01 NOTE — Patient Instructions (Signed)
 Medication Instructions:  Your physician recommends that you continue on your current medications as directed. Please refer to the Current Medication list given to you today.  *If you need a refill on your cardiac medications before your next appointment, please call your pharmacy*  Follow-Up: At [facility] you and your health needs are our priority.  As part of our continuing mission to provide you with exceptional heart care, our providers are all part of one team.  This team includes your primary Cardiologist (physician) and Advanced Practice Providers or APPs (Physician Assistants and Nurse Practitioners) who all work together to provide you with the care you need, when you need it.  Your next appointment:   1 year(s)  Provider:   Kardie Tobb, DO     Other Instructions KardiaMobile [email or web address]        FDA-cleared, clinical grade mobile EKG monitor: Crist is the most clinically-validated mobile EKG used by the world's leading cardiac care medical professionals With Basic service, know instantly if your heart rhythm is normal or if atrial fibrillation is detected, and email the last single EKG recording to yourself or your doctor Premium service, available for purchase through the Kardia app for $9.99 per month or $99 per year, includes unlimited history and storage of your EKG recordings, a monthly EKG summary report to share with your doctor, along with the ability to track your blood pressure, activity and weight Includes one KardiaMobile phone clip FREE SHIPPING: Standard delivery 1-3 business days. Orders placed by 11:00am PST will ship that afternoon. Otherwise, will ship next business day. All orders ship via [employer] from [place], [place]    [employer] - sending an EKG Download app and set up profile. Run EKG - by placing 1-2 fingers on the silver plates After EKG is complete - Download PDF  - Skip password (if you apply  a password the provider will need it to view the EKG) Click share button (square with upward arrow) in bottom left corner To send: choose MyChart (first time log into MyChart)  Pop up window about sending ECG Click continue Choose type of message Choose provider Type subject and message Click send (EKG should be attached)  - To send additional EKGs in one message click the paperclip image and bottom of page to attach.

## 2024-02-01 NOTE — Progress Notes (Signed)
 Cardiology Office Note:    Date:  02/04/2024   ID:  Stacey Bennett, DOB 05-06-1966, MRN [id number]  PCP:  Sun, Vyvyan, MD  Cardiologist:  Rayna Brenner, DO  Electrophysiologist:  None   Referring MD: Sun, Vyvyan, MD    I want to talk about hormone replacement therapy   History of Present Illness:    Stacey Bennett is a 58 y.o. female with a hx of minimal coronary artery disease, prior paroxysmal supraventricular tachycardia, hyperlipidemia, prediabetes.  I last saw the patient in September 2023 at that time we discussed her CT scan result showing minimal coronary artery disease.  I shared with the patient at that time her ZIO monitor result as well.  Since I saw the patient she tells me she has been doing well.  She is here today to discuss the use of home replacement therapy.  Past Medical History:  Diagnosis Date   Ankle pain, left    Basal cell cancer    Basal cell carcinoma    Fracture, fibula    Hip pain, left    Metatarsalgia    Stress fracture of right ankle    Supraventricular tachycardia (HCC)    Syncope    Tenosynovitis of foot and ankle     Past Surgical History:  Procedure Laterality Date   CESAREAN SECTION     MOHS SURGERY     WISDOM TOOTH EXTRACTION      Current Medications: Current Meds  Medication Sig   rosuvastatin  (CRESTOR ) 10 MG tablet Take 1 tablet (10 mg total) by mouth daily.     Allergies:   Patient has no known allergies.   Social History   Socioeconomic History   Marital status: Married    Spouse name: Not on file   Number of children: Not on file   Years of education: Not on file   Highest education level: Not on file  Occupational History   Not on file  Tobacco Use   Smoking status: Never   Smokeless tobacco: Never  Substance and Sexual Activity   Alcohol use: Not Currently    Alcohol/week: 5.0 standard drinks of alcohol    Types: 5 Standard drinks or equivalent per week   Drug use: No   Sexual activity: Yes    Birth  control/protection: None  Other Topics Concern   Not on file  Social History Narrative      Married and has one 56 year old boy.          Lives with Lyn---sig other   Social Drivers of Health   Financial Resource Strain: Not on file  Food Insecurity: Not on file  Transportation Needs: Not on file  Physical Activity: Not on file  Stress: Not on file  Social Connections: Not on file     Family History: The patient's family history is not on file.  ROS:   Review of Systems  Constitution: Negative for decreased appetite, fever and weight gain.  HENT: Negative for congestion, ear discharge, hoarse voice and sore throat.   Eyes: Negative for discharge, redness, vision loss in right eye and visual halos.  Cardiovascular: Negative for chest pain, dyspnea on exertion, leg swelling, orthopnea and palpitations.  Respiratory: Negative for cough, hemoptysis, shortness of breath and snoring.   Endocrine: Negative for heat intolerance and polyphagia.  Hematologic/Lymphatic: Negative for bleeding problem. Does not bruise/bleed easily.  Skin: Negative for flushing, nail changes, rash and suspicious lesions.  Musculoskeletal: Negative for arthritis, joint pain, muscle  cramps, myalgias, neck pain and stiffness.  Gastrointestinal: Negative for abdominal pain, bowel incontinence, diarrhea and excessive appetite.  Genitourinary: Negative for decreased libido, genital sores and incomplete emptying.  Neurological: Negative for brief paralysis, focal weakness, headaches and loss of balance.  Psychiatric/Behavioral: Negative for altered mental status, depression and suicidal ideas.  Allergic/Immunologic: Negative for HIV exposure and persistent infections.    EKGs/Labs/Other Studies Reviewed:    The following studies were reviewed today:   EKG:  The ekg ordered today demonstrates sinus bradycardia, HR 56 bpm  Recent Labs: 11/26/2023: TSH 1.03  Recent Lipid Panel No results found for: CHOL,  TRIG, HDL, CHOLHDL, VLDL, LDLCALC, LDLDIRECT  Physical Exam:    VS:  BP 94/60 (BP Location: Left Arm, Patient Position: Sitting, Cuff Size: Normal)   Pulse (!) 56   Ht 5' 7 (1.702 m)   Wt 150 lb 8 oz (68.3 kg)   LMP 06/05/2012   BMI 23.57 kg/m     Wt Readings from Last 3 Encounters:  02/01/24 150 lb 8 oz (68.3 kg)  04/15/22 143 lb (64.9 kg)  02/27/22 147 lb 9.6 oz (67 kg)     GEN: Well nourished, well developed in no acute distress HEENT: Normal NECK: No JVD; No carotid bruits LYMPHATICS: No lymphadenopathy CARDIAC: S1S2 noted,RRR, no murmurs, rubs, gallops RESPIRATORY:  Clear to auscultation without rales, wheezing or rhonchi  ABDOMEN: Soft, non-tender, non-distended, +bowel sounds, no guarding. EXTREMITIES: No edema, No cyanosis, no clubbing MUSCULOSKELETAL:  No deformity  SKIN: Warm and dry NEUROLOGIC:  Alert and oriented x 3, non-focal PSYCHIATRIC:  Normal affect, good insight  ASSESSMENT:    1. Palpitations   2. Sinus bradycardia   3. Minimal CAD   4. PSVT (paroxysmal supraventricular tachycardia) (HCC)   5. Mixed hyperlipidemia   6. Prediabetes   7. Encounter for medication counseling    PLAN:     CAD -minimal CAD no anginal symptoms.  She is currently on rosuvastatin  10 mg daily.  Reviewed her last lipid profile which was done in May 2025 showing that her LDL was 90.  I shared with the patient diet information.  Will repeat her lipid profile at her next visit should you still be 90 or greater than 70 would adjust her lipid-lowering agent to Crestor  20 mg daily.  Symptomatically paroxysmal SVT-denies any symptoms unless in the presence of alcohol use she feels some palpitations.  We discussed the use of Kardia mobile.  For now no medications has been ordered.   Hyperlipidemia-continue the Crestor  10 mg for now.  Repeat lipid profile if LDL still greater than 90 we will increase Crestor  to 20 mg daily.  Prediabetes-lifestyle modification  She is  interested in home replacement therapy.  She is going to see a menopause specialist.  In light of her very minimal coronary artery disease I think she can benefit from hormone replacement therapy under close supervision.  The patient is in agreement with the above plan. The patient left the office in stable condition.  The patient will follow up in   Medication Adjustments/Labs and Tests Ordered: Current medicines are reviewed at length with the patient today.  Concerns regarding medicines are outlined above.  Orders Placed This Encounter  Procedures   EKG 12-Lead   No orders of the defined types were placed in this encounter.   Patient Instructions  Medication Instructions:  Your physician recommends that you continue on your current medications as directed. Please refer to the Current Medication list given to you  today.  *If you need a refill on your cardiac medications before your next appointment, please call your pharmacy*  Follow-Up: At [facility], you and your health needs are our priority.  As part of our continuing mission to provide you with exceptional heart care, our providers are all part of one team.  This team includes your primary Cardiologist (physician) and Advanced Practice Providers or APPs (Physician Assistants and Nurse Practitioners) who all work together to provide you with the care you need, when you need it.  Your next appointment:   1 year(s)  Provider:   Reign Dziuba, DO     Other Instructions KardiaMobile [email or web address]        FDA-cleared, clinical grade mobile EKG monitor: Crist is the most clinically-validated mobile EKG used by the world's leading cardiac care medical professionals With Basic service, know instantly if your heart rhythm is normal or if atrial fibrillation is detected, and email the last single EKG recording to yourself or your doctor Premium service, available for purchase through the  Kardia app for $9.99 per month or $99 per year, includes unlimited history and storage of your EKG recordings, a monthly EKG summary report to share with your doctor, along with the ability to track your blood pressure, activity and weight Includes one KardiaMobile phone clip FREE SHIPPING: Standard delivery 1-3 business days. Orders placed by 11:00am PST will ship that afternoon. Otherwise, will ship next business day. All orders ship via [employer] from [place], [place]    [employer] - sending an EKG Download app and set up profile. Run EKG - by placing 1-2 fingers on the silver plates After EKG is complete - Download PDF  - Skip password (if you apply a password the provider will need it to view the EKG) Click share button (square with upward arrow) in bottom left corner To send: choose MyChart (first time log into MyChart)  Pop up window about sending ECG Click continue Choose type of message Choose provider Type subject and message Click send (EKG should be attached)  - To send additional EKGs in one message click the paperclip image and bottom of page to attach.      Adopting a Healthy Lifestyle.  Know what a healthy weight is for you (roughly BMI <25) and aim to maintain this   Aim for 7+ servings of fruits and vegetables daily   65-80+ fluid ounces of water or unsweet tea for healthy kidneys   Limit to max 1 drink of alcohol per day; avoid smoking/tobacco   Limit animal fats in diet for cholesterol and heart health - choose grass fed whenever available   Avoid highly processed foods, and foods high in saturated/trans fats   Aim for low stress - take time to unwind and care for your mental health   Aim for 150 min of moderate intensity exercise weekly for heart health, and weights twice weekly for bone health   Aim for 7-9 hours of sleep daily   When it comes to diets, agreement about the perfect plan isnt easy to find, even among the experts. Experts at the  [facility] [employer] developed an idea known as the Healthy Eating Plate. Just imagine a plate divided into logical, healthy portions.   The emphasis is on diet quality:   Load up on vegetables and fruits - one-half of your plate: Aim for color and variety, and remember that potatoes dont count.   Go for whole grains - one-quarter of  your plate: Whole wheat, barley, wheat berries, quinoa, oats, brown rice, and foods made with them. If you want pasta, go with whole wheat pasta.   Protein power - one-quarter of your plate: Fish, chicken, beans, and nuts are all healthy, versatile protein sources. Limit red meat.   The diet, however, does go beyond the plate, offering a few other suggestions.   Use healthy plant oils, such as olive, canola, soy, corn, sunflower and peanut. Check the labels, and avoid partially hydrogenated oil, which have unhealthy trans fats.   If youre thirsty, drink water. Coffee and tea are good in moderation, but skip sugary drinks and limit milk and dairy products to one or two daily servings.   The type of carbohydrate in the diet is more important than the amount. Some sources of carbohydrates, such as vegetables, fruits, whole grains, and beans-are healthier than others.   Finally, stay active  Signed, Dub Huntsman, DO  02/04/2024 8:32 PM    [facility]

## 2024-04-03 DIAGNOSIS — N951 Menopausal and female climacteric states: Secondary | ICD-10-CM | POA: Diagnosis not present

## 2024-04-15 ENCOUNTER — Other Ambulatory Visit: Payer: Self-pay | Admitting: Cardiology

## 2024-05-12 ENCOUNTER — Telehealth: Payer: Self-pay | Admitting: Cardiology

## 2024-05-12 MED ORDER — ROSUVASTATIN CALCIUM 10 MG PO TABS: 10.0000 mg | ORAL_TABLET | Freq: Every day | ORAL | 2 refills | Status: AC

## 2024-05-12 NOTE — Telephone Encounter (Signed)
*  STAT* If patient is at the pharmacy, call can be transferred to refill team.   1. Which medications need to be refilled? (please list name of each medication and dose if known) rosuvastatin  (CRESTOR ) 10 MG tablet    2. Would you like to learn more about the convenience, safety, & potential cost savings by using the [facility]?    3. Are you open to using the [facility] (Type [facility].  ).   4. Which pharmacy/location (including street and city if local pharmacy) is medication to be sent to? [id number] - [place], [place] - [address]    5. Do they need a 30 day or 90 day supply? 90 day

## 2024-05-12 NOTE — Telephone Encounter (Signed)
 Refill sent.

## 2024-12-28 ENCOUNTER — Ambulatory Visit: Admitting: Dermatology
